# Patient Record
Sex: Female | Born: 1975 | Race: Black or African American | Hispanic: No | Marital: Married | Smoking: Never smoker
Health system: Southern US, Community
[De-identification: ages and names within clinical notes are randomized; demographics above are authoritative.]

## PROBLEM LIST (undated history)

## (undated) ENCOUNTER — Emergency Department (HOSPITAL_COMMUNITY): Admission: EM | Source: Ambulatory Visit

## (undated) ENCOUNTER — Inpatient Hospital Stay (HOSPITAL_COMMUNITY): Payer: Self-pay

## (undated) DIAGNOSIS — E785 Hyperlipidemia, unspecified: Secondary | ICD-10-CM

## (undated) DIAGNOSIS — Z98891 History of uterine scar from previous surgery: Secondary | ICD-10-CM

## (undated) DIAGNOSIS — I10 Essential (primary) hypertension: Secondary | ICD-10-CM

## (undated) DIAGNOSIS — E119 Type 2 diabetes mellitus without complications: Secondary | ICD-10-CM

## (undated) DIAGNOSIS — E669 Obesity, unspecified: Secondary | ICD-10-CM

## (undated) DIAGNOSIS — Z302 Encounter for sterilization: Secondary | ICD-10-CM

## (undated) HISTORY — DX: Type 2 diabetes mellitus without complications: E11.9

## (undated) HISTORY — DX: Hyperlipidemia, unspecified: E78.5

## (undated) HISTORY — DX: Essential (primary) hypertension: I10

## (undated) HISTORY — PX: GASTRIC BYPASS: SHX52

---

## 2007-02-15 ENCOUNTER — Ambulatory Visit: Payer: Self-pay | Admitting: Family Medicine

## 2007-02-16 ENCOUNTER — Ambulatory Visit: Payer: Self-pay | Admitting: Internal Medicine

## 2007-02-17 ENCOUNTER — Ambulatory Visit: Payer: Self-pay | Admitting: *Deleted

## 2008-03-21 ENCOUNTER — Ambulatory Visit: Payer: Self-pay | Admitting: Internal Medicine

## 2008-05-17 ENCOUNTER — Ambulatory Visit: Payer: Self-pay | Admitting: Gynecology

## 2008-05-31 ENCOUNTER — Encounter: Payer: Self-pay | Admitting: Obstetrics & Gynecology

## 2008-05-31 ENCOUNTER — Ambulatory Visit: Payer: Self-pay | Admitting: Obstetrics & Gynecology

## 2009-02-14 ENCOUNTER — Ambulatory Visit: Payer: Self-pay | Admitting: Obstetrics & Gynecology

## 2009-02-14 ENCOUNTER — Ambulatory Visit (HOSPITAL_COMMUNITY): Admission: RE | Admit: 2009-02-14 | Discharge: 2009-02-14 | Payer: Self-pay | Admitting: Obstetrics & Gynecology

## 2009-02-14 ENCOUNTER — Encounter: Payer: Self-pay | Admitting: Obstetrics and Gynecology

## 2009-02-14 LAB — CONVERTED CEMR LAB
GC Probe Amp, Genital: NEGATIVE
Hgb A2 Quant: 3.2 % (ref 2.2–3.2)
Hgb F Quant: 0.8 % (ref 0.0–2.0)

## 2009-03-14 ENCOUNTER — Ambulatory Visit: Payer: Self-pay | Admitting: Obstetrics & Gynecology

## 2009-03-14 ENCOUNTER — Ambulatory Visit (HOSPITAL_COMMUNITY): Admission: RE | Admit: 2009-03-14 | Discharge: 2009-03-14 | Payer: Self-pay | Admitting: Obstetrics & Gynecology

## 2009-03-14 ENCOUNTER — Encounter: Payer: Self-pay | Admitting: Obstetrics and Gynecology

## 2009-03-14 LAB — CONVERTED CEMR LAB
Basophils Absolute: 0 10*3/uL (ref 0.0–0.1)
Basophils Relative: 0 % (ref 0–1)
Hepatitis B Surface Ag: NEGATIVE
MCHC: 33.3 g/dL (ref 30.0–36.0)
Monocytes Relative: 9 % (ref 3–12)
Neutro Abs: 5.3 10*3/uL (ref 1.7–7.7)
Neutrophils Relative %: 65 % (ref 43–77)
RBC: 3.67 M/uL — ABNORMAL LOW (ref 3.87–5.11)
RDW: 15.1 % (ref 11.5–15.5)

## 2009-04-04 ENCOUNTER — Ambulatory Visit: Payer: Self-pay | Admitting: Obstetrics & Gynecology

## 2009-04-28 ENCOUNTER — Ambulatory Visit: Payer: Self-pay | Admitting: Advanced Practice Midwife

## 2009-04-28 ENCOUNTER — Inpatient Hospital Stay (HOSPITAL_COMMUNITY): Admission: AD | Admit: 2009-04-28 | Discharge: 2009-04-28 | Payer: Self-pay | Admitting: Family Medicine

## 2009-05-02 ENCOUNTER — Ambulatory Visit: Payer: Self-pay | Admitting: Obstetrics and Gynecology

## 2009-08-02 ENCOUNTER — Ambulatory Visit: Payer: Self-pay | Admitting: Obstetrics & Gynecology

## 2009-08-02 ENCOUNTER — Inpatient Hospital Stay (HOSPITAL_COMMUNITY): Admission: RE | Admit: 2009-08-02 | Discharge: 2009-08-05 | Payer: Self-pay | Admitting: Obstetrics & Gynecology

## 2010-11-26 LAB — CBC
HCT: 40.3 % (ref 36.0–46.0)
Hemoglobin: 12.2 g/dL (ref 12.0–15.0)
Hemoglobin: 13.7 g/dL (ref 12.0–15.0)
MCHC: 33.6 g/dL (ref 30.0–36.0)
MCHC: 34.1 g/dL (ref 30.0–36.0)
MCV: 95.7 fL (ref 78.0–100.0)
MCV: 98 fL (ref 78.0–100.0)
RBC: 3.72 MIL/uL — ABNORMAL LOW (ref 3.87–5.11)
RBC: 4.21 MIL/uL (ref 3.87–5.11)

## 2010-11-26 LAB — CROSSMATCH

## 2010-11-26 LAB — RPR: RPR Ser Ql: NONREACTIVE

## 2010-11-29 LAB — CBC
Hemoglobin: 12.2 g/dL (ref 12.0–15.0)
MCHC: 34.3 g/dL (ref 30.0–36.0)
RBC: 3.78 MIL/uL — ABNORMAL LOW (ref 3.87–5.11)
WBC: 7.4 10*3/uL (ref 4.0–10.5)

## 2010-11-29 LAB — DIFFERENTIAL
Basophils Relative: 0 % (ref 0–1)
Lymphocytes Relative: 22 % (ref 12–46)
Lymphs Abs: 1.7 10*3/uL (ref 0.7–4.0)
Monocytes Absolute: 1 10*3/uL (ref 0.1–1.0)
Monocytes Relative: 13 % — ABNORMAL HIGH (ref 3–12)
Neutro Abs: 4.8 10*3/uL (ref 1.7–7.7)

## 2010-11-29 LAB — POCT URINALYSIS DIP (DEVICE)
Bilirubin Urine: NEGATIVE
Hgb urine dipstick: NEGATIVE
Ketones, ur: 80 mg/dL — AB
Protein, ur: 30 mg/dL — AB
Specific Gravity, Urine: 1.02 (ref 1.005–1.030)
pH: 6 (ref 5.0–8.0)

## 2010-11-29 LAB — URINALYSIS, ROUTINE W REFLEX MICROSCOPIC
Bilirubin Urine: NEGATIVE
Nitrite: NEGATIVE
Specific Gravity, Urine: >1.03 — ABNORMAL HIGH (ref 1.005–1.030)
pH: 6 (ref 5.0–8.0)

## 2010-11-30 LAB — POCT URINALYSIS DIP (DEVICE)
Hgb urine dipstick: NEGATIVE
Ketones, ur: 15 mg/dL — AB
Protein, ur: 30 mg/dL — AB
Specific Gravity, Urine: >=1.03 (ref 1.005–1.030)
Urobilinogen, UA: 1 mg/dL (ref 0.0–1.0)
pH: 6 (ref 5.0–8.0)

## 2010-12-01 LAB — POCT URINALYSIS DIP (DEVICE)
Hgb urine dipstick: NEGATIVE
Nitrite: NEGATIVE
Specific Gravity, Urine: 1.025 (ref 1.005–1.030)
Urobilinogen, UA: 0.2 mg/dL (ref 0.0–1.0)
pH: 5.5 (ref 5.0–8.0)

## 2010-12-02 LAB — POCT URINALYSIS DIP (DEVICE)
Hgb urine dipstick: NEGATIVE
Nitrite: NEGATIVE
Protein, ur: 30 mg/dL — AB
pH: 6 (ref 5.0–8.0)

## 2011-01-07 NOTE — Group Therapy Note (Signed)
NAMEFRANCA, STAKES NO.:  [id number]   MEDICAL RECORD NO.:  [id number]          PATIENT TYPE:  WOC   LOCATION:  [facility]                   FACILITY:  [facility]   PHYSICIAN:  Allie Bossier, MD        DATE OF BIRTH:  [birth year]   DATE OF SERVICE:                                  CLINIC NOTE   IDENTIFICATION:  Penny Luna is a 35-yeawr-old married [place], gravida  2, para 80, with an 37-year-old daughter and a 50-year-old son.  She comes  here because she has never had a Pap smear and she has been having  unprotected intercourse for the last 2 years attempting to achieve  pregnancy.  She has not succeeded with a pregnancy and wants to.  She  has been on folic acid and multivitamins on a regular basis.  She also  reports that [place], she has gained  70 pounds.   PAST MEDICAL HISTORY:  Obesity.   PAST SURGICAL HISTORY:  A cesarean section x2.   No known drug allergies.  No latex allergies.   Social history is negative except occasional alcohol.   Family history is positive for question cervical cancer in her mother  who is deceased.   REVIEW OF SYSTEMS:  She has been married for the last 2 years.  This is  a different man than the father of her children.  She is a Printmaker at  [employer]  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   MEDICATIONS:  She takes multivitamins and folic acid daily as well as  Tylenol on an as-necessary basis.   PHYSICAL EXAMINATION:  VITAL SIGNS:  Weight 242 pounds, height is 5 feet  and 5 inches, blood pressure 132/80, pulse 94.  She is afebrile.  HEENT:  Normal.  HEART:  Regular rate and rhythm.  LUNGS:  Clear to auscultation bilaterally.  BREASTS:  Large with no discrete masses or nipple discharge or skin  changes.  ABDOMEN:  Obese, benign.  EXTERNAL GENITALIA:  No lesions.  Cervix nulliparous, no lesions.  BIMANUAL:  Her uterus is normal size, shape, anteverted.  Her adnexa are  nontender, and there are no  masses.   ASSESSMENT AND PLAN:  1. Annual exam.  Checked a Pap smear along with cervical cultures,      recommend self-breast and self-vulvar exams monthly.  2. A 70-pound weight gain - probably related to her new American diet;      however, we will happily check a thyroid-stimulating hormone today.  3. Obesity.  I have certainly recommended weight loss and I will check      a random blood sugar today.  4. Infertility.  I will check a day-3 follicle-stimulating hormone on      Sheleen, and her husband will need to produce a specimen.  She will      also keep a menstrual calendar and bring this to [place] in      approximately 2 months.      Allie Bossier, MD     MCD/MEDQ  D:  05/31/2008  T:  06/01/2008  Job:  [id number]

## 2013-02-05 ENCOUNTER — Emergency Department (HOSPITAL_COMMUNITY)
Admission: EM | Admit: 2013-02-05 | Discharge: 2013-02-06 | Disposition: A | Discharge status: Home / Self Care | Payer: Medicaid Other | Attending: Emergency Medicine | Admitting: Emergency Medicine

## 2013-02-05 ENCOUNTER — Encounter (HOSPITAL_COMMUNITY): Payer: Self-pay | Admitting: Emergency Medicine

## 2013-02-05 DIAGNOSIS — O219 Vomiting of pregnancy, unspecified: Secondary | ICD-10-CM

## 2013-02-05 DIAGNOSIS — R110 Nausea: Secondary | ICD-10-CM | POA: Insufficient documentation

## 2013-02-05 DIAGNOSIS — R509 Fever, unspecified: Secondary | ICD-10-CM | POA: Insufficient documentation

## 2013-02-05 DIAGNOSIS — R197 Diarrhea, unspecified: Secondary | ICD-10-CM | POA: Insufficient documentation

## 2013-02-05 LAB — COMPREHENSIVE METABOLIC PANEL
AST: 24 U/L (ref 0–37)
Albumin: 3.5 g/dL (ref 3.5–5.2)
Alkaline Phosphatase: 39 U/L (ref 39–117)
Chloride: 101 mEq/L (ref 96–112)
Potassium: 4.4 mEq/L (ref 3.5–5.1)
Sodium: 133 mEq/L — ABNORMAL LOW (ref 135–145)
Total Bilirubin: 0.2 mg/dL — ABNORMAL LOW (ref 0.3–1.2)

## 2013-02-05 LAB — URINALYSIS, ROUTINE W REFLEX MICROSCOPIC
Bilirubin Urine: NEGATIVE
Glucose, UA: NEGATIVE mg/dL
Hgb urine dipstick: NEGATIVE
Leukocytes, UA: NEGATIVE
Nitrite: NEGATIVE
Specific Gravity, Urine: 1.023 (ref 1.005–1.030)

## 2013-02-05 LAB — CBC WITH DIFFERENTIAL/PLATELET
Basophils Absolute: 0 10*3/uL (ref 0.0–0.1)
Basophils Relative: 0 % (ref 0–1)
Hemoglobin: 12.4 g/dL (ref 12.0–15.0)
MCHC: 35.6 g/dL (ref 30.0–36.0)
Neutro Abs: 5.9 10*3/uL (ref 1.7–7.7)
Neutrophils Relative %: 57 % (ref 43–77)
RDW: 14.1 % (ref 11.5–15.5)

## 2013-02-05 MED ORDER — ONDANSETRON HCL 4 MG/2ML IJ SOLN
4.0000 mg | INTRAMUSCULAR | Status: AC
  Administered 2013-02-05: 4 mg via INTRAVENOUS
  Filled 2013-02-05: qty 2

## 2013-02-05 MED ORDER — ACETAMINOPHEN 325 MG PO TABS
650.0000 mg | ORAL_TABLET | Freq: Once | ORAL | Status: AC
  Administered 2013-02-05: 650 mg via ORAL
  Filled 2013-02-05: qty 2

## 2013-02-05 MED ORDER — SODIUM CHLORIDE 0.9 % IV BOLUS (SEPSIS)
1000.0000 mL | Freq: Once | INTRAVENOUS | Status: AC
  Administered 2013-02-05: 1000 mL via INTRAVENOUS

## 2013-02-05 MED ORDER — ONDANSETRON HCL 4 MG PO TABS: 4.0000 mg | ORAL_TABLET | Freq: Three times a day (TID) | ORAL | Status: DC | PRN

## 2013-02-05 MED ORDER — SODIUM CHLORIDE 0.9 % IV BOLUS (SEPSIS): 1000.0000 mL | Freq: Once | INTRAVENOUS | Status: DC

## 2013-02-05 NOTE — ED Notes (Signed)
Kelly Humes, PA at bedside. 

## 2013-02-05 NOTE — ED Notes (Signed)
PT. REPORTS EMESIS ONSET 2 WEEKS AGO / DIARRHEA ONSET 2 DAYS AGO , PT. STATED SHE IS [redacted] WEEKS PREGNANT - NO PRENATAL VISITS , DENIES VAGINAL BLEEDING OR CRAMPING .

## 2013-02-05 NOTE — ED Provider Notes (Signed)
History     CSN: [id number]  Arrival date & time 02/05/13  1918   First MD Initiated Contact with Patient 02/05/13 2032      Chief Complaint  Patient presents with  . Emesis During Pregnancy    (Consider location/radiation/quality/duration/timing/severity/associated sxs/prior treatment) HPI Comments: Patient is a 37 year old female with no significant past medical history who presents for nausea and NB/NB emesis x 3 weeks, gradually worsening x 3 days. Patient states she is [redacted] weeks pregnant and has yet to be for a prenatal visit. Patient endorses improvement in symptoms with ginger ale when symptoms began, but this is no longer helping her. She denies aggravating factors. Patient admits to associated subjective fevers ("felt warm") and watery, nonbloody diarrhea x 3 days that is intermittent. She denies CP, SOB, abdominal pain or cramping, vaginal bleeding, vaginal d/c, dysuria, hematuria, and numbness or tingling in her extremities.  The history is provided by the patient. No language interpreter was used.    History reviewed. No pertinent past medical history.  Past Surgical History  Procedure Laterality Date  . Cesarean section      No family history on file.  History  Substance Use Topics  . Smoking status: Never Smoker   . Smokeless tobacco: Not on file  . Alcohol Use: No    OB History   Grav Para Term Preterm Abortions TAB SAB Ect Mult Living                  Review of Systems  Constitutional: Positive for fever (subjective). Negative for chills.  Respiratory: Negative for shortness of breath.   Cardiovascular: Negative for chest pain.  Gastrointestinal: Positive for nausea, vomiting and diarrhea. Negative for abdominal pain and blood in stool.  Genitourinary: Negative for dysuria, frequency, hematuria, vaginal bleeding, vaginal discharge, vaginal pain and pelvic pain.  Neurological: Negative for weakness and numbness.  All other systems reviewed and are  negative.    Allergies  Review of patient's allergies indicates no known allergies.  Home Medications   Current Outpatient Rx  Name  Route  Sig  Dispense  Refill  . Prenatal Vit-Fe Fumarate-FA (PRENATAL MULTIVITAMIN) TABS   Oral   Take 1 tablet by mouth daily at 12 noon.         . ondansetron (ZOFRAN) 4 MG tablet   Oral   Take 1 tablet (4 mg total) by mouth every 8 (eight) hours as needed for nausea.   30 tablet   0     BP 116/64  Pulse 81  Temp(Src) 98.3 F (36.8 C) (Oral)  Resp 16  SpO2 100%  LMP 12/29/2012  Physical Exam  Nursing note and vitals reviewed. Constitutional: She is oriented to person, place, and time. She appears well-developed and well-nourished. No distress.  HENT:  Head: Normocephalic and atraumatic.  Mouth/Throat: Oropharynx is clear and moist. No oropharyngeal exudate.  Eyes: Conjunctivae and EOM are normal. Pupils are equal, round, and reactive to light. No scleral icterus.  Neck: Normal range of motion. Neck supple.  Cardiovascular: Normal rate, regular rhythm, normal heart sounds and intact distal pulses.   Pulmonary/Chest: Effort normal and breath sounds normal. No respiratory distress. She has no wheezes. She has no rales.  Abdominal: Soft. Bowel sounds are normal. She exhibits no distension. There is no tenderness. There is no rebound and no guarding.  Obese abdomen. No peritoneal signs or palpable abdominal masses.  Musculoskeletal: Normal range of motion. She exhibits no edema.  Lymphadenopathy:    She  has no cervical adenopathy.  Neurological: She is alert and oriented to person, place, and time.  Skin: Skin is warm and dry. No rash noted. She is not diaphoretic. No erythema. No pallor.  Psychiatric: She has a normal mood and affect. Her behavior is normal.    ED Course  Procedures (including critical care time)  Labs Reviewed  URINALYSIS, ROUTINE W REFLEX MICROSCOPIC - Abnormal; Notable for the following:    APPearance CLOUDY  (*)    All other components within normal limits  CBC WITH DIFFERENTIAL - Abnormal; Notable for the following:    HCT 34.8 (*)    All other components within normal limits  COMPREHENSIVE METABOLIC PANEL - Abnormal; Notable for the following:    Sodium 133 (*)    Total Bilirubin 0.2 (*)    All other components within normal limits  POCT PREGNANCY, URINE - Abnormal; Notable for the following:    Preg Test, Ur POSITIVE (*)    All other components within normal limits   No results found.   1. Nausea and vomiting in pregnancy     MDM  37 year old female with positive urine pregnancy in ED who presents for nausea and emesis x3 weeks worsening over the last 3 days. On physical exam patient is extremely well and nontoxic appearing, in no acute distress. No focal abdominal tenderness or peritoneal signs elicited on physical exam; heart RRR, lungs CTAB. Labs without leukocytosis, anemia, evidence of hemoconcentration, or significant electrolyte imbalance. Liver and kidney function preserved. Urinalysis without evidence of infection. Symptoms c/w hyperemesis gravidarum. Will treat with IV fluids and Zofran. Patient complaining also of mild frontal throbbing headache; tylenol PO ordered. Do not believe further work up with imaging is warranted at this time given lack of complicating features of pregnancy such as abdominal pain or cramping and vaginal bleeding or d/c.   Patient endorses improvement in symptoms with IV fluids and Zofran. Patient tolerating fluids by mouth without emesis. Patient also states that headache is resolved with Tylenol. Patient appropriate for discharge with OB/GYN followup. Resource guide provided as well as a prescription for Zofran to take as needed for nausea and vomiting. Indications for ED return discussed. Patient verbalizes comfort and understanding with this discharge plan with no unaddressed concerns.      Antony Madura, PA-C 02/06/13 [phone]

## 2013-02-05 NOTE — ED Notes (Signed)
Second dose of zofran given, patient given ginger ale for fluid challenge will continue to monitor

## 2013-02-06 NOTE — ED Provider Notes (Signed)
Medical screening examination/treatment/procedure(s) were performed by non-physician practitioner and as supervising physician I was immediately available for consultation/collaboration.  Hurman Horn, MD 02/06/13 [phone]

## 2013-04-06 LAB — OB RESULTS CONSOLE HIV ANTIBODY (ROUTINE TESTING): HIV: NONREACTIVE

## 2013-04-06 LAB — OB RESULTS CONSOLE RPR: RPR: NONREACTIVE

## 2013-07-23 ENCOUNTER — Inpatient Hospital Stay (HOSPITAL_COMMUNITY)
Admission: AD | Admit: 2013-07-23 | Discharge: 2013-07-23 | Disposition: A | Discharge status: Home / Self Care | Payer: Medicaid Other | Source: Ambulatory Visit | Attending: Obstetrics and Gynecology | Admitting: Obstetrics and Gynecology

## 2013-07-23 ENCOUNTER — Encounter (HOSPITAL_COMMUNITY): Payer: Self-pay

## 2013-07-23 DIAGNOSIS — O4700 False labor before 37 completed weeks of gestation, unspecified trimester: Secondary | ICD-10-CM | POA: Insufficient documentation

## 2013-07-23 DIAGNOSIS — O99891 Other specified diseases and conditions complicating pregnancy: Secondary | ICD-10-CM | POA: Insufficient documentation

## 2013-07-23 DIAGNOSIS — N949 Unspecified condition associated with female genital organs and menstrual cycle: Secondary | ICD-10-CM

## 2013-07-23 DIAGNOSIS — R109 Unspecified abdominal pain: Secondary | ICD-10-CM | POA: Insufficient documentation

## 2013-07-23 LAB — URINALYSIS, ROUTINE W REFLEX MICROSCOPIC
Bilirubin Urine: NEGATIVE
Glucose, UA: NEGATIVE mg/dL
Ketones, ur: NEGATIVE mg/dL
Leukocytes, UA: NEGATIVE
pH: 6.5 (ref 5.0–8.0)

## 2013-07-23 LAB — CBC
HCT: 35.8 % — ABNORMAL LOW (ref 36.0–46.0)
Hemoglobin: 12.5 g/dL (ref 12.0–15.0)
MCHC: 34.9 g/dL (ref 30.0–36.0)

## 2013-07-23 MED ORDER — CYCLOBENZAPRINE HCL 10 MG PO TABS
10.0000 mg | ORAL_TABLET | Freq: Once | ORAL | Status: AC
  Administered 2013-07-23: 10 mg via ORAL
  Filled 2013-07-23: qty 1

## 2013-07-23 MED ORDER — CYCLOBENZAPRINE HCL 10 MG PO TABS: 10.0000 mg | ORAL_TABLET | Freq: Two times a day (BID) | ORAL | Status: DC | PRN

## 2013-07-23 NOTE — MAU Note (Signed)
Pt states for past two days has had pain on right side/flank. Denies burning. Has been constipated. Denies bleeding or vag d/c changes

## 2013-07-23 NOTE — MAU Provider Note (Signed)
History     CSN: [id number]  Arrival date and time: 07/23/13 1159   First Provider Initiated Contact with Patient 07/23/13 1257      Chief Complaint  Patient presents with  . Abdominal Pain   HPI [name] is a 37 y.o. [phone] at [redacted]w[redacted]d who presents to MAU today with complaint of right sided abdominal pain x 2-3 weeks. The patient states that it became more severe last night. She took Tylenol without relief. She states that the pain is worse with ambulation and change of position. 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 She has had some N/V throughout the pregnancy, last episode was 2 days ago. She is having some heartburn as well. She states that she is having occasional contractions. She denies vaginal bleeding, discharge, LOF or fever. She reports good fetal movement.   OB History   Grav Para Term Preterm Abortions TAB SAB Ect Mult Living   4 3 3       3       History reviewed. No pertinent past medical history.  Past Surgical History  Procedure Laterality Date  . Cesarean section      History reviewed. No pertinent family history.  History  Substance Use Topics  . Smoking status: Never Smoker   . Smokeless tobacco: Not on file  . Alcohol Use: No    Allergies:  Allergies  Allergen Reactions  . Lactose Intolerance (Gi) Nausea Only    Prescriptions prior to admission  Medication Sig Dispense Refill  . Prenatal Vit-Fe Fumarate-FA (PRENATAL MULTIVITAMIN) TABS Take 1 tablet by mouth daily at 12 noon.        Review of Systems  Constitutional: Negative for fever and malaise/fatigue.  Gastrointestinal: Positive for nausea, vomiting, abdominal pain and constipation. Negative for diarrhea.  Genitourinary: Negative for dysuria, urgency and frequency.       Neg - vaginal bleeding, discharge, LOF   Physical Exam   Blood pressure 138/80, pulse 100, temperature 97.7 F (36.5 C), temperature source Oral, resp. rate 16, last menstrual period  12/29/2012.  Physical Exam  Constitutional: She is oriented to person, place, and time. She appears well-developed and well-nourished. No distress.  HENT:  Head: Normocephalic and atraumatic.  Cardiovascular: Normal rate, regular rhythm and normal heart sounds.   Respiratory: Effort normal and breath sounds normal. No respiratory distress.  GI: Soft. Bowel sounds are normal. She exhibits no distension and no mass. There is tenderness (moderate tenderness to palpation of the right mid abdomen). There is no rebound and no guarding.  Neurological: She is alert and oriented to person, place, and time.  Skin: Skin is warm and dry. No erythema.  Psychiatric: She has a normal mood and affect.  Dilation: Closed Effacement (%): Thick Exam by:: Sharen Hint RN/Ginger Cornelius Moras  Results for orders placed during the hospital encounter of 07/23/13 (from the past 24 hour(s))  URINALYSIS, ROUTINE W REFLEX MICROSCOPIC     Status: None   Collection Time    07/23/13 12:05 PM      Result Value Range   Color, Urine YELLOW  YELLOW   APPearance CLEAR  CLEAR   Specific Gravity, Urine 1.010  1.005 - 1.030   pH 6.5  5.0 - 8.0   Glucose, UA NEGATIVE  NEGATIVE mg/dL   Hgb urine dipstick NEGATIVE  NEGATIVE   Bilirubin Urine NEGATIVE  NEGATIVE   Ketones, ur NEGATIVE  NEGATIVE mg/dL   Protein, ur NEGATIVE  NEGATIVE mg/dL   Urobilinogen, UA 0.2  0.0 - 1.0 mg/dL   Nitrite NEGATIVE  NEGATIVE   Leukocytes, UA NEGATIVE  NEGATIVE  CBC     Status: Abnormal   Collection Time    07/23/13  1:27 PM      Result Value Range   WBC 9.3  4.0 - 10.5 K/uL   RBC 3.93  3.87 - 5.11 MIL/uL   Hemoglobin 12.5  12.0 - 15.0 g/dL   HCT 16.1 (*) 09.6 - 04.5 %   MCV 91.1  78.0 - 100.0 fL   MCH 31.8  26.0 - 34.0 pg   MCHC 34.9  30.0 - 36.0 g/dL   RDW 40.9  81.1 - 91.4 %   Platelets 308  150 - 400 K/uL   Fetal Monitoring: Baseline: 130 bpm, moderate variability, + accelerations, no decelerations Contractions: None MAU  Course  Procedures None  MDM Discussed with Dr. Senaida Ores. Ok for discharge with Flexeril and moderation of work activities.   Assessment and Plan  A: Round ligament pain  P: Discharge home Rx for Flexeril sent to patient's pharmacy Discussed labor precautions Work note given with lifting restrictions Patient advised to follow-up in the office as scheduled Patient may return to MAU as needed or if her condition were to change or worsen  Freddi Starr, PA-C  07/23/2013, 2:17 PM

## 2013-07-31 ENCOUNTER — Encounter (HOSPITAL_COMMUNITY): Payer: Self-pay | Admitting: *Deleted

## 2013-07-31 ENCOUNTER — Inpatient Hospital Stay (HOSPITAL_COMMUNITY)
Admission: AD | Admit: 2013-07-31 | Discharge: 2013-08-01 | Disposition: A | Discharge status: Home / Self Care | Payer: Worker's Compensation | Source: Ambulatory Visit | Attending: Obstetrics and Gynecology | Admitting: Obstetrics and Gynecology

## 2013-07-31 DIAGNOSIS — O99891 Other specified diseases and conditions complicating pregnancy: Secondary | ICD-10-CM | POA: Insufficient documentation

## 2013-07-31 DIAGNOSIS — W010XXA Fall on same level from slipping, tripping and stumbling without subsequent striking against object, initial encounter: Secondary | ICD-10-CM | POA: Insufficient documentation

## 2013-07-31 DIAGNOSIS — W108XXA Fall (on) (from) other stairs and steps, initial encounter: Secondary | ICD-10-CM

## 2013-07-31 DIAGNOSIS — Y9229 Other specified public building as the place of occurrence of the external cause: Secondary | ICD-10-CM | POA: Insufficient documentation

## 2013-07-31 DIAGNOSIS — W19XXXA Unspecified fall, initial encounter: Secondary | ICD-10-CM

## 2013-07-31 DIAGNOSIS — M25469 Effusion, unspecified knee: Secondary | ICD-10-CM | POA: Insufficient documentation

## 2013-07-31 HISTORY — DX: Obesity, unspecified: E66.9

## 2013-07-31 LAB — URINALYSIS, ROUTINE W REFLEX MICROSCOPIC
Glucose, UA: NEGATIVE mg/dL
Hgb urine dipstick: NEGATIVE
Protein, ur: NEGATIVE mg/dL

## 2013-07-31 NOTE — MAU Note (Signed)
Pt tripped over the footrest of a wheelchair at work and fell onto her right knee, them right hip and right side of abdomen.  Her knee is very sore and slightly swollen as well as her low abdomen.  States her right hip hurts upon walking.  Denies any vag bleeding or leaking, reports good fetal movement.

## 2013-07-31 NOTE — MAU Provider Note (Signed)
  History     CSN: [id number]  Arrival date and time: 07/31/13 2028   First Provider Initiated Contact with Patient 07/31/13 2113      No chief complaint on file.  HPI  Penny Luna is a 37 y.o. G4P3003 at [redacted]w[redacted]d who presents today after a fall at work. She states that at 1905 she tripped over a wheelchair footrest. She then fell onto her right knee and then her right sided. She did hit her abdomen in the process. She denies any VB, LOF or UCs, and states that the baby has been moving normally. She does report feeling sore, and some knee pain as well.   Past Medical History  Diagnosis Date  . Obese     Past Surgical History  Procedure Laterality Date  . Cesarean section      Family History  Problem Relation Age of Onset  . Cancer Mother   . Hypertension Mother   . Diabetes Maternal Aunt   . Diabetes Cousin     History  Substance Use Topics  . Smoking status: Never Smoker   . Smokeless tobacco: Not on file  . Alcohol Use: No    Allergies:  Allergies  Allergen Reactions  . Lactose Intolerance (Gi) Nausea Only    Prescriptions prior to admission  Medication Sig Dispense Refill  . Prenatal Vit-Fe Fumarate-FA (PRENATAL MULTIVITAMIN) TABS Take 1 tablet by mouth daily at 12 noon.        ROS Physical Exam   Blood pressure 135/70, pulse 112, temperature 98.2 F (36.8 C), temperature source Oral, resp. rate 18, height 5\' 7"  (1.702 m), weight 122.471 kg (270 lb), last menstrual period 12/29/2012.  Physical Exam  Nursing note and vitals reviewed. Constitutional: She is oriented to person, place, and time. She appears well-developed and well-nourished. No distress.  Cardiovascular: Normal rate.   Respiratory: Effort normal.  GI: Soft. There is no tenderness.  Genitourinary:   Closed/thick/high   Musculoskeletal: Normal range of motion.  Right knee is swollen, but maintains ROM.   Neurological: She is alert and oriented to person, place, and time.  Skin: Skin is  warm and dry.  Psychiatric: She has a normal mood and affect.   NST: 140, moderate with accels, no decel Toco: rare UC MAU Course  Procedures  2139: D/W Dr. Ambrose Mantle: will monitor X 4 hours. If NST is reactive may be DC home. Continue to watch knee. If swelling or pain increases consider transfer/consult with [facility]. If pain/swelling is stable consider Urgent Care FU for tomorrow.   Assessment and Plan   1. Fall, initial encounter    Follow-up Information   Follow up with Bing Plume, MD. (as scheduled )    Specialty:  Obstetrics and Gynecology   Contact information:   [address], [address], [address] [place] [address] [phone]        Tawnya Crook 07/31/2013, 9:18 PM

## 2013-08-01 MED ORDER — OXYCODONE-ACETAMINOPHEN 5-325 MG PO TABS
2.0000 | ORAL_TABLET | Freq: Once | ORAL | Status: AC
  Administered 2013-08-01: 2 via ORAL
  Filled 2013-08-01: qty 2

## 2013-08-01 MED ORDER — HYDROCODONE-ACETAMINOPHEN 7.5-500 MG PO TABS: 1.0000 | ORAL_TABLET | Freq: Four times a day (QID) | ORAL | Status: DC | PRN

## 2013-08-12 ENCOUNTER — Encounter (HOSPITAL_COMMUNITY): Payer: Self-pay | Admitting: Pharmacist

## 2013-08-26 ENCOUNTER — Encounter (HOSPITAL_COMMUNITY)
Admission: RE | Admit: 2013-08-26 | Discharge: 2013-08-26 | Disposition: A | Discharge status: Home / Self Care | Payer: Medicaid Other | Source: Ambulatory Visit | Attending: Obstetrics and Gynecology | Admitting: Obstetrics and Gynecology

## 2013-08-26 ENCOUNTER — Encounter (HOSPITAL_COMMUNITY): Payer: Self-pay

## 2013-08-26 DIAGNOSIS — Z01818 Encounter for other preprocedural examination: Secondary | ICD-10-CM

## 2013-08-26 DIAGNOSIS — Z01812 Encounter for preprocedural laboratory examination: Secondary | ICD-10-CM | POA: Insufficient documentation

## 2013-08-26 LAB — CBC
HCT: 37.1 % (ref 36.0–46.0)
Hemoglobin: 13 g/dL (ref 12.0–15.0)
MCH: 32.1 pg (ref 26.0–34.0)
MCHC: 35 g/dL (ref 30.0–36.0)
MCV: 91.6 fL (ref 78.0–100.0)
PLATELETS: 300 10*3/uL (ref 150–400)
RBC: 4.05 MIL/uL (ref 3.87–5.11)
RDW: 14.7 % (ref 11.5–15.5)
WBC: 8 10*3/uL (ref 4.0–10.5)

## 2013-08-26 LAB — RPR: RPR Ser Ql: NONREACTIVE

## 2013-08-26 NOTE — Patient Instructions (Signed)
18 Penny Luna  08/26/2013   Your procedure is scheduled on:  08/29/13  Enter through the Main Entrance [facility] at 6 AM.  Pick up the phone at the desk and dial [phone].   Call this number if you have problems the morning of surgery: [phone]   Remember:   Do not eat food:After Midnight.  Do not drink clear liquids: After Midnight.  Take these medicines the morning of surgery with A SIP OF WATER: NA   Do not wear jewelry, make-up or nail polish.  Do not wear lotions, powders, or perfumes. You may wear deodorant.  Do not shave 48 hours prior to surgery.  Do not bring valuables to the hospital.  [facility] is not   responsible for any belongings or valuables brought to the hospital.  Contacts, dentures or bridgework may not be worn into surgery.  Leave suitcase in the car. After surgery it may be brought to your room.  For patients admitted to the hospital, checkout time is 11:00 AM the day of              discharge.   Patients discharged the day of surgery will not be allowed to drive             home.  Name and phone number of your driver: NA  Special Instructions:   Shower using CHG 2 nights before surgery and the night before surgery.  If you shower the day of surgery use CHG.  Use special wash - you have one bottle of CHG for all showers.  You should use approximately 1/3 of the bottle for each shower.   Please read over the following fact sheets that you were given:   Surgical Site Infection Prevention

## 2013-08-28 ENCOUNTER — Encounter (HOSPITAL_COMMUNITY): Payer: Self-pay | Admitting: Obstetrics and Gynecology

## 2013-08-28 DIAGNOSIS — Z302 Encounter for sterilization: Secondary | ICD-10-CM

## 2013-08-28 DIAGNOSIS — Z3480 Encounter for supervision of other normal pregnancy, unspecified trimester: Secondary | ICD-10-CM

## 2013-08-28 DIAGNOSIS — Z98891 History of uterine scar from previous surgery: Secondary | ICD-10-CM

## 2013-08-28 HISTORY — DX: Encounter for sterilization: Z30.2

## 2013-08-28 HISTORY — DX: History of uterine scar from previous surgery: Z98.891

## 2013-08-28 NOTE — H&P (Signed)
Garnette Czechliza Addo-Lumsden is a 38 y.o. female [phone] at 39+ for repeat LTCS also desires BTLby B salpingectomy. +FM, no LOF,no VB, occ ctx. D/W pt r/b/a of rLTCS and B salpingectomy including but not limited to bleeding, infection, damage to surrounding organs, trouble healing,and injury to infant.  Pregnancy relatively uncomplicated, except late entry to care, apx 16 wk and + GBBS  Maternal Medical History:  Contractions: Frequency: irregular.    Fetal activity: Perceived fetal activity is normal.    Prenatal Complications - Diabetes: none.    OB History   Grav Para Term Preterm Abortions TAB SAB Ect Mult Living   4 3 3       3     G1 5/01 breech, female, 7#12 LTCS G2 10/03 failed VBAC, female 7#10 LTCS G3 12/10 repeat LTCS 7#12, female G4 present No abn pap, no STDs  Past Medical History  Diagnosis Date  . Obese   . Encounter for sterilization 08/28/2013  . H/O: cesarean section 08/28/2013   Past Surgical History  Procedure Laterality Date  . Cesarean section    c/s x 3  Family History: family history includes Cancer in her mother; Diabetes in her cousin and maternal aunt; Hypertension in her mother. Social History:  reports that she has never smoked. She does not have any smokeless tobacco history on file. 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   HENT: Negative.   Eyes: Negative.   Respiratory: Negative.   Cardiovascular: Negative.   Gastrointestinal: Negative.   Genitourinary: Negative.   Musculoskeletal:  Negative.   Skin: Negative.   Neurological: Negative.   Psychiatric/Behavioral: Negative.       Last menstrual period 12/29/2012. Maternal Exam:  Uterine Assessment: Contraction frequency is irregular.   Abdomen: Surgical scars: low transverse.   Fundal height is appropriate for gestation (S>D, followed by US).   Estimated fetal weight is 8#4.   Fetal presentation: vertex  Introitus: Normal vulva. Normal vagina.    Physical Exam  Constitutional: She is oriented to person, place, and time. She appears well-developed and well-nourished.  HENT:  Head: Normocephalic and atraumatic.  Cardiovascular: Normal rate and regular rhythm.   Respiratory: Effort normal and breath sounds normal. No respiratory distress. She has no wheezes.  GI: Soft. Bowel sounds are normal. She exhibits no distension. There is no tenderness.  Musculoskeletal: Normal range of motion.  Neurological: She is alert and oriented to person, place, and time.  Skin: Skin is warm and dry.  Psychiatric: She has a normal mood and affect. Her behavior is normal.    Prenatal labs: ABO, Rh: --/--/O POS (01/02 0945) Antibody: NEG (01/02 0945) Rubella:  immune RPR: NON REACTIVE (01/02 0945)  HBsAg:   neg HIV: Non-reactive (08/13 0948)  GBS:   positive  Hgb 11.8/ Plt 402K, repeat 333K/Pap WNL/Ur Cx neg/Hgb electro WNL/GC neg/ Chl neg/ glucola 133  US 16+EDC 09/05/13, female US 19 wk, nl anat, rt wall placenta, female S>D 10/29 76% growth, nl AFI, vtx S>D 12/31 84% 8#4, nl AFI, vtx, rt wall plac  Tdap 06/06/13 Flu  05/16/13   Assessment/Plan: 37yo [id number] at 37 for rLTCS and B salpingectomy gbbs + Reviewed r/b/a of procedure   BOVARD,Velvet Moomaw 08/28/2013, 8:50 PM

## 2013-08-29 ENCOUNTER — Inpatient Hospital Stay (HOSPITAL_COMMUNITY): Payer: Medicaid Other | Admitting: Anesthesiology

## 2013-08-29 ENCOUNTER — Encounter (HOSPITAL_COMMUNITY)
Admission: RE | Disposition: A | Discharge status: Home / Self Care | Payer: Self-pay | Source: Ambulatory Visit | Attending: Obstetrics and Gynecology

## 2013-08-29 ENCOUNTER — Inpatient Hospital Stay (HOSPITAL_COMMUNITY)
Admission: RE | Admit: 2013-08-29 | Discharge: 2013-09-01 | DRG: 766 | Disposition: A | Discharge status: Home / Self Care | Payer: Medicaid Other | Source: Ambulatory Visit | Attending: Obstetrics and Gynecology | Admitting: Obstetrics and Gynecology

## 2013-08-29 ENCOUNTER — Encounter (HOSPITAL_COMMUNITY): Payer: Medicaid Other | Admitting: Anesthesiology

## 2013-08-29 ENCOUNTER — Encounter (HOSPITAL_COMMUNITY): Payer: Self-pay

## 2013-08-29 DIAGNOSIS — Z302 Encounter for sterilization: Secondary | ICD-10-CM

## 2013-08-29 DIAGNOSIS — Z22330 Carrier of Group B streptococcus: Secondary | ICD-10-CM

## 2013-08-29 DIAGNOSIS — O09529 Supervision of elderly multigravida, unspecified trimester: Secondary | ICD-10-CM | POA: Diagnosis present

## 2013-08-29 DIAGNOSIS — O34219 Maternal care for unspecified type scar from previous cesarean delivery: Principal | ICD-10-CM | POA: Diagnosis present

## 2013-08-29 DIAGNOSIS — O9989 Other specified diseases and conditions complicating pregnancy, childbirth and the puerperium: Secondary | ICD-10-CM

## 2013-08-29 DIAGNOSIS — Z3480 Encounter for supervision of other normal pregnancy, unspecified trimester: Secondary | ICD-10-CM

## 2013-08-29 DIAGNOSIS — O99892 Other specified diseases and conditions complicating childbirth: Secondary | ICD-10-CM | POA: Diagnosis present

## 2013-08-29 DIAGNOSIS — Z98891 History of uterine scar from previous surgery: Secondary | ICD-10-CM

## 2013-08-29 HISTORY — DX: Encounter for sterilization: Z30.2

## 2013-08-29 HISTORY — DX: History of uterine scar from previous surgery: Z98.891

## 2013-08-29 LAB — COMPREHENSIVE METABOLIC PANEL
ALK PHOS: 112 U/L (ref 39–117)
ALT: 17 U/L (ref 0–35)
AST: 23 U/L (ref 0–37)
Albumin: 2.6 g/dL — ABNORMAL LOW (ref 3.5–5.2)
BILIRUBIN TOTAL: 0.2 mg/dL — AB (ref 0.3–1.2)
BUN: 10 mg/dL (ref 6–23)
CHLORIDE: 101 meq/L (ref 96–112)
CO2: 23 meq/L (ref 19–32)
Calcium: 9.2 mg/dL (ref 8.4–10.5)
Creatinine, Ser: 0.73 mg/dL (ref 0.50–1.10)
GFR calc Af Amer: >90 mL/min (ref >90)
Glucose, Bld: 98 mg/dL (ref 70–99)
POTASSIUM: 4.5 meq/L (ref 3.7–5.3)
Sodium: 137 mEq/L (ref 137–147)
Total Protein: 5.7 g/dL — ABNORMAL LOW (ref 6.0–8.3)

## 2013-08-29 LAB — PREPARE RBC (CROSSMATCH)

## 2013-08-29 SURGERY — Surgical Case
Anesthesia: Spinal | Site: Abdomen | Laterality: Bilateral

## 2013-08-29 MED ORDER — METOCLOPRAMIDE HCL 5 MG/ML IJ SOLN: 10.0000 mg | Freq: Three times a day (TID) | INTRAMUSCULAR | Status: DC | PRN

## 2013-08-29 MED ORDER — DIPHENHYDRAMINE HCL 25 MG PO CAPS: 25.0000 mg | ORAL_CAPSULE | Freq: Four times a day (QID) | ORAL | Status: DC | PRN

## 2013-08-29 MED ORDER — ONDANSETRON HCL 4 MG/2ML IJ SOLN
INTRAMUSCULAR | Status: DC | PRN
  Administered 2013-08-29: 4 mg via INTRAVENOUS

## 2013-08-29 MED ORDER — SODIUM CHLORIDE 0.9 % IJ SOLN: 3.0000 mL | INTRAMUSCULAR | Status: DC | PRN

## 2013-08-29 MED ORDER — MENTHOL 3 MG MT LOZG: 1.0000 | LOZENGE | OROMUCOSAL | Status: DC | PRN

## 2013-08-29 MED ORDER — KETOROLAC TROMETHAMINE 30 MG/ML IJ SOLN
30.0000 mg | Freq: Four times a day (QID) | INTRAMUSCULAR | Status: AC | PRN
  Administered 2013-08-29: 30 mg via INTRAMUSCULAR

## 2013-08-29 MED ORDER — DIBUCAINE 1 % RE OINT: 1.0000 "application " | TOPICAL_OINTMENT | RECTAL | Status: DC | PRN

## 2013-08-29 MED ORDER — ONDANSETRON HCL 4 MG/2ML IJ SOLN: 4.0000 mg | INTRAMUSCULAR | Status: DC | PRN

## 2013-08-29 MED ORDER — SCOPOLAMINE 1 MG/3DAYS TD PT72
MEDICATED_PATCH | TRANSDERMAL | Status: AC
  Filled 2013-08-29: qty 1

## 2013-08-29 MED ORDER — BUPIVACAINE IN DEXTROSE 0.75-8.25 % IT SOLN
INTRATHECAL | Status: DC | PRN
  Administered 2013-08-29: 1.6 mL via INTRATHECAL

## 2013-08-29 MED ORDER — SIMETHICONE 80 MG PO CHEW
80.0000 mg | CHEWABLE_TABLET | ORAL | Status: DC
  Administered 2013-08-29 – 2013-08-31 (×3): 80 mg via ORAL
  Filled 2013-08-29 (×3): qty 1

## 2013-08-29 MED ORDER — ACETAMINOPHEN 500 MG PO TABS
1000.0000 mg | ORAL_TABLET | Freq: Four times a day (QID) | ORAL | Status: AC
  Administered 2013-08-29 – 2013-08-30 (×3): 1000 mg via ORAL
  Filled 2013-08-29 (×4): qty 2

## 2013-08-29 MED ORDER — DEXTROSE 5 % IV SOLN
3.0000 g | INTRAVENOUS | Status: AC
  Administered 2013-08-29: 3 g via INTRAVENOUS
  Filled 2013-08-29: qty 3000

## 2013-08-29 MED ORDER — ONDANSETRON HCL 4 MG PO TABS: 4.0000 mg | ORAL_TABLET | ORAL | Status: DC | PRN

## 2013-08-29 MED ORDER — IBUPROFEN 800 MG PO TABS
800.0000 mg | ORAL_TABLET | Freq: Three times a day (TID) | ORAL | Status: DC
  Administered 2013-08-29 – 2013-09-01 (×9): 800 mg via ORAL
  Filled 2013-08-29 (×9): qty 1

## 2013-08-29 MED ORDER — LACTATED RINGERS IV SOLN
Freq: Once | INTRAVENOUS | Status: AC
Start: 2013-08-29 — End: 2013-08-29
  Administered 2013-08-29 (×3): via INTRAVENOUS

## 2013-08-29 MED ORDER — WITCH HAZEL-GLYCERIN EX PADS: 1.0000 "application " | MEDICATED_PAD | CUTANEOUS | Status: DC | PRN

## 2013-08-29 MED ORDER — NALOXONE HCL 0.4 MG/ML IJ SOLN: 0.4000 mg | INTRAMUSCULAR | Status: DC | PRN

## 2013-08-29 MED ORDER — PRENATAL MULTIVITAMIN CH: 1.0000 | ORAL_TABLET | Freq: Every day | ORAL | Status: DC

## 2013-08-29 MED ORDER — LACTATED RINGERS IV SOLN
INTRAVENOUS | Status: DC
  Administered 2013-08-29 (×2): via INTRAVENOUS

## 2013-08-29 MED ORDER — FENTANYL CITRATE 0.05 MG/ML IJ SOLN: 25.0000 ug | INTRAMUSCULAR | Status: DC | PRN

## 2013-08-29 MED ORDER — IBUPROFEN 600 MG PO TABS: 600.0000 mg | ORAL_TABLET | Freq: Four times a day (QID) | ORAL | Status: DC | PRN

## 2013-08-29 MED ORDER — SENNOSIDES-DOCUSATE SODIUM 8.6-50 MG PO TABS
2.0000 | ORAL_TABLET | ORAL | Status: DC
  Administered 2013-08-29 – 2013-08-30 (×2): 2 via ORAL
  Filled 2013-08-29 (×3): qty 2

## 2013-08-29 MED ORDER — NALBUPHINE HCL 10 MG/ML IJ SOLN
5.0000 mg | INTRAMUSCULAR | Status: DC | PRN
  Administered 2013-08-29 (×2): 5 mg via SUBCUTANEOUS
  Filled 2013-08-29 (×3): qty 1

## 2013-08-29 MED ORDER — ONDANSETRON HCL 4 MG/2ML IJ SOLN
INTRAMUSCULAR | Status: AC
  Filled 2013-08-29: qty 2

## 2013-08-29 MED ORDER — PRENATAL MULTIVITAMIN CH
1.0000 | ORAL_TABLET | Freq: Every day | ORAL | Status: DC
  Administered 2013-08-29 – 2013-08-31 (×3): 1 via ORAL
  Filled 2013-08-29 (×3): qty 1

## 2013-08-29 MED ORDER — PHENYLEPHRINE 8 MG IN D5W 100 ML (0.08MG/ML) PREMIX OPTIME
INJECTION | INTRAVENOUS | Status: DC | PRN
Start: 2013-08-29 — End: 2013-08-29
  Administered 2013-08-29: 40 ug/min via INTRAVENOUS

## 2013-08-29 MED ORDER — DIPHENHYDRAMINE HCL 50 MG/ML IJ SOLN: 25.0000 mg | INTRAMUSCULAR | Status: DC | PRN

## 2013-08-29 MED ORDER — NALBUPHINE HCL 10 MG/ML IJ SOLN
5.0000 mg | INTRAMUSCULAR | Status: DC | PRN
  Administered 2013-08-30: 10 mg via INTRAVENOUS
  Filled 2013-08-29 (×2): qty 1

## 2013-08-29 MED ORDER — MORPHINE SULFATE 0.5 MG/ML IJ SOLN
INTRAMUSCULAR | Status: AC
  Filled 2013-08-29: qty 10

## 2013-08-29 MED ORDER — KETOROLAC TROMETHAMINE 30 MG/ML IJ SOLN: 30.0000 mg | Freq: Four times a day (QID) | INTRAMUSCULAR | Status: AC | PRN

## 2013-08-29 MED ORDER — SCOPOLAMINE 1 MG/3DAYS TD PT72
1.0000 | MEDICATED_PATCH | Freq: Once | TRANSDERMAL | Status: DC
  Filled 2013-08-29: qty 1

## 2013-08-29 MED ORDER — DEXTROSE 5 % IV SOLN
1.0000 ug/kg/h | INTRAVENOUS | Status: DC | PRN
  Filled 2013-08-29: qty 2

## 2013-08-29 MED ORDER — SCOPOLAMINE 1 MG/3DAYS TD PT72
1.0000 | MEDICATED_PATCH | Freq: Once | TRANSDERMAL | Status: DC
  Administered 2013-08-29: 1.5 mg via TRANSDERMAL

## 2013-08-29 MED ORDER — ZOLPIDEM TARTRATE 5 MG PO TABS: 5.0000 mg | ORAL_TABLET | Freq: Every evening | ORAL | Status: DC | PRN

## 2013-08-29 MED ORDER — PROMETHAZINE HCL 25 MG/ML IJ SOLN: 6.2500 mg | INTRAMUSCULAR | Status: DC | PRN

## 2013-08-29 MED ORDER — MIDAZOLAM HCL 2 MG/2ML IJ SOLN: 0.5000 mg | Freq: Once | INTRAMUSCULAR | Status: DC | PRN

## 2013-08-29 MED ORDER — ONDANSETRON HCL 4 MG/2ML IJ SOLN: 4.0000 mg | Freq: Three times a day (TID) | INTRAMUSCULAR | Status: DC | PRN

## 2013-08-29 MED ORDER — OXYTOCIN 10 UNIT/ML IJ SOLN
INTRAMUSCULAR | Status: AC
  Filled 2013-08-29: qty 4

## 2013-08-29 MED ORDER — SIMETHICONE 80 MG PO CHEW: 80.0000 mg | CHEWABLE_TABLET | ORAL | Status: DC | PRN

## 2013-08-29 MED ORDER — OXYTOCIN 40 UNITS IN LACTATED RINGERS INFUSION - SIMPLE MED: 62.5000 mL/h | INTRAVENOUS | Status: AC

## 2013-08-29 MED ORDER — PHENYLEPHRINE 8 MG IN D5W 100 ML (0.08MG/ML) PREMIX OPTIME
INJECTION | INTRAVENOUS | Status: AC
  Filled 2013-08-29: qty 100

## 2013-08-29 MED ORDER — FENTANYL CITRATE 0.05 MG/ML IJ SOLN
INTRAMUSCULAR | Status: DC | PRN
  Administered 2013-08-29: 25 ug via INTRATHECAL

## 2013-08-29 MED ORDER — LACTATED RINGERS IV SOLN
INTRAVENOUS | Status: DC | PRN
  Administered 2013-08-29: 08:00:00 via INTRAVENOUS

## 2013-08-29 MED ORDER — MORPHINE SULFATE (PF) 0.5 MG/ML IJ SOLN
INTRAMUSCULAR | Status: DC | PRN
  Administered 2013-08-29: .15 mg via INTRATHECAL

## 2013-08-29 MED ORDER — KETOROLAC TROMETHAMINE 30 MG/ML IJ SOLN
INTRAMUSCULAR | Status: AC
  Filled 2013-08-29: qty 1

## 2013-08-29 MED ORDER — LANOLIN HYDROUS EX OINT: 1.0000 "application " | TOPICAL_OINTMENT | CUTANEOUS | Status: DC | PRN

## 2013-08-29 MED ORDER — SIMETHICONE 80 MG PO CHEW
80.0000 mg | CHEWABLE_TABLET | Freq: Three times a day (TID) | ORAL | Status: DC
  Administered 2013-08-29 – 2013-09-01 (×9): 80 mg via ORAL
  Filled 2013-08-29 (×9): qty 1

## 2013-08-29 MED ORDER — LACTATED RINGERS IV SOLN
INTRAVENOUS | Status: DC
  Administered 2013-08-29: 07:00:00 via INTRAVENOUS

## 2013-08-29 MED ORDER — OXYCODONE-ACETAMINOPHEN 5-325 MG PO TABS
1.0000 | ORAL_TABLET | ORAL | Status: DC | PRN
  Administered 2013-08-29 – 2013-09-01 (×7): 1 via ORAL
  Filled 2013-08-29 (×7): qty 1

## 2013-08-29 MED ORDER — DIPHENHYDRAMINE HCL 50 MG/ML IJ SOLN: 12.5000 mg | INTRAMUSCULAR | Status: DC | PRN

## 2013-08-29 MED ORDER — DIPHENHYDRAMINE HCL 25 MG PO CAPS
25.0000 mg | ORAL_CAPSULE | ORAL | Status: DC | PRN
  Administered 2013-08-30: 25 mg via ORAL
  Filled 2013-08-29: qty 1

## 2013-08-29 MED ORDER — OXYTOCIN 10 UNIT/ML IJ SOLN
40.0000 [IU] | INTRAVENOUS | Status: DC | PRN
  Administered 2013-08-29: 40 [IU] via INTRAVENOUS

## 2013-08-29 MED ORDER — MEPERIDINE HCL 25 MG/ML IJ SOLN: 6.2500 mg | INTRAMUSCULAR | Status: DC | PRN

## 2013-08-29 MED ORDER — FENTANYL CITRATE 0.05 MG/ML IJ SOLN
INTRAMUSCULAR | Status: AC
  Filled 2013-08-29: qty 2

## 2013-08-29 SURGICAL SUPPLY — 40 items
APL SKNCLS STERI-STRIP NONHPOA (GAUZE/BANDAGES/DRESSINGS) ×1
BENZOIN TINCTURE PRP APPL 2/3 (GAUZE/BANDAGES/DRESSINGS) ×2 IMPLANT
CLAMP CORD UMBIL (MISCELLANEOUS) IMPLANT
CLOSURE WOUND 1/2 X4 (GAUZE/BANDAGES/DRESSINGS) ×1
CLOTH BEACON ORANGE TIMEOUT ST (SAFETY) ×3 IMPLANT
CONTAINER PREFILL 10% NBF 15ML (MISCELLANEOUS) ×4 IMPLANT
DRAPE LG THREE QUARTER DISP (DRAPES) IMPLANT
DRSG OPSITE POSTOP 4X10 (GAUZE/BANDAGES/DRESSINGS) ×3 IMPLANT
DURAPREP 26ML APPLICATOR (WOUND CARE) ×3 IMPLANT
ELECT REM PT RETURN 9FT ADLT (ELECTROSURGICAL) ×3
ELECTRODE REM PT RTRN 9FT ADLT (ELECTROSURGICAL) ×1 IMPLANT
EXTRACTOR VACUUM M CUP 4 TUBE (SUCTIONS) ×1 IMPLANT
EXTRACTOR VACUUM M CUP 4' TUBE (SUCTIONS) ×1
GLOVE BIO SURGEON STRL SZ 6.5 (GLOVE) ×2 IMPLANT
GLOVE BIO SURGEONS STRL SZ 6.5 (GLOVE) ×1
GOWN PREVENTION PLUS XLARGE (GOWN DISPOSABLE) ×3 IMPLANT
GOWN STRL NON-REIN LRG LVL3 (GOWN DISPOSABLE) ×3 IMPLANT
GOWN STRL REIN XL XLG (GOWN DISPOSABLE) ×3 IMPLANT
KIT ABG SYR 3ML LUER SLIP (SYRINGE) IMPLANT
NDL HYPO 25X5/8 SAFETYGLIDE (NEEDLE) IMPLANT
NEEDLE HYPO 25X5/8 SAFETYGLIDE (NEEDLE) IMPLANT
NS IRRIG 1000ML POUR BTL (IV SOLUTION) ×3 IMPLANT
PACK C SECTION WH (CUSTOM PROCEDURE TRAY) ×3 IMPLANT
PAD OB MATERNITY 4.3X12.25 (PERSONAL CARE ITEMS) ×3 IMPLANT
RTRCTR C-SECT PINK 25CM LRG (MISCELLANEOUS) ×3 IMPLANT
STAPLER VISISTAT 35W (STAPLE) IMPLANT
STRIP CLOSURE SKIN 1/2X4 (GAUZE/BANDAGES/DRESSINGS) ×1 IMPLANT
SUT MNCRL 0 VIOLET CTX 36 (SUTURE) ×2 IMPLANT
SUT MONOCRYL 0 CTX 36 (SUTURE) ×4
SUT PLAIN 1 NONE 54 (SUTURE) IMPLANT
SUT PLAIN 2 0 XLH (SUTURE) ×3 IMPLANT
SUT VIC AB 0 CT1 27 (SUTURE) ×6
SUT VIC AB 0 CT1 27XBRD ANBCTR (SUTURE) ×2 IMPLANT
SUT VIC AB 2-0 CT1 27 (SUTURE) ×3
SUT VIC AB 2-0 CT1 TAPERPNT 27 (SUTURE) ×1 IMPLANT
SUT VIC AB 4-0 KS 27 (SUTURE) IMPLANT
SYR BULB IRRIGATION 50ML (SYRINGE) ×3 IMPLANT
TOWEL OR 17X24 6PK STRL BLUE (TOWEL DISPOSABLE) ×3 IMPLANT
TRAY FOLEY CATH 14FR (SET/KITS/TRAYS/PACK) IMPLANT
WATER STERILE IRR 1000ML POUR (IV SOLUTION) ×3 IMPLANT

## 2013-08-29 NOTE — Brief Op Note (Signed)
08/29/2013  8:50 AM  PATIENT:  Penny Luna  38 y.o. female  PRE-OPERATIVE DIAGNOSIS:  Repeat C/Section & BTL  POST-OPERATIVE DIAGNOSIS:  Repeat C/Section & BTL  PROCEDURE:  Procedure(s): CESAREAN SECTION WITH BILATERAL TUBAL LIGATION (Bilateral salpingectomy)  SURGEON:  Surgeon(s) and Role:    * Sherron MondayJody Bovard, MD - Primary    * Lavina Hammanodd Meisinger, MD - Assisting  ANESTHESIA:   spinal  EBL:  Total I/O In: 2500 [I.V.:2500] Out: 675 [Urine:75; Blood:600]  FINDINGS: viable female infant at 8:01, apgars P at dictation, wt P at dictation; nl PP uterus, tubes and ovaries  BLOOD ADMINISTERED:none  DRAINS: Urinary Catheter (Foley)   LOCAL MEDICATIONS USED:  NONE  SPECIMEN:  Source of Specimen:  Placenta and B tubal segments  DISPOSITION OF SPECIMEN:  L&D and pathology  COUNTS:  YES  TOURNIQUET:  * No tourniquets in log *  DICTATION: .Other Dictation: Dictation Number [id number]  PLAN OF CARE: Admit to inpatient   PATIENT DISPOSITION:  PACU - hemodynamically stable.   Delay start of Pharmacological VTE agent (>24hrs) due to surgical blood loss or risk of bleeding: not applicable

## 2013-08-29 NOTE — Anesthesia Postprocedure Evaluation (Signed)
Anesthesia Post Note  Patient: Penny Luna  Procedure(s) Performed: Procedure(s) (LRB): CESAREAN SECTION WITH BILATERAL TUBAL LIGATION (Bilateral)  Anesthesia type: Spinal  Patient location: PACU  Post pain: Pain level controlled  Post assessment: Post-op Vital signs reviewed  Last Vitals:  Filed Vitals:   08/29/13 0625  BP: 150/85  Pulse: 118  Temp: 36.7 C  Resp: 18    Post vital signs: Reviewed  Level of consciousness: awake  Complications: No apparent anesthesia complications

## 2013-08-29 NOTE — Interval H&P Note (Signed)
History and Physical Interval Note:  08/29/2013 7:10 AM  Penny Luna  has presented today for surgery, with the diagnosis of Repeat C/Section & BTL  The various methods of treatment have been discussed with the patient and family. After consideration of risks, benefits and other options for treatment, the patient has consented to  Procedure(s): CESAREAN SECTION WITH BILATERAL TUBAL LIGATION (Bilateral) as a surgical intervention .  The patient's history has been reviewed, patient examined, no change in status, stable for surgery.  I have reviewed the patient's chart and labs.  Questions were answered to the patient's satisfaction.     BOVARD,[name]

## 2013-08-29 NOTE — Lactation Note (Signed)
This note was copied from the chart of Penny Luna. Lactation Consultation Note  Patient Name: Penny Luna ZOXWR'UToday's Date: 08/29/2013 Reason for consult: Initial assessment;Other (Comment) (charting for exclusion; baby weighs >9 lbs).  Baby has breastfed six times since birth for 10-40 minutes at each feeding and mom has readily expressible/flowing colostrum.  LC arrived in room to observe baby well-latched on mom's (R) breast with widely flanged lips, rhythmical sucking bursts and intermittent swallows.  Mom nursed with her second and third child, for 11 months and 8 months respectively.  LC encouraged cue feedings ad lib and discussed supply and demand for milk production and need for frequent stimulation at breast.  LC also discussed benefits of STS and demonstrated hand expression to demonstrate her readily available milk. LC encouraged review of Baby and Me pp 9, 14 and 20-25 for STS and BF information. LC provided [employer] [facility] brochure and reviewed [facility] services and list of community and web site resources.     Maternal Data Reason for exclusion: Mother's choice to formula and breast feed on admission Infant to breast within first hour of birth: Yes Has patient been taught Hand Expression?: Yes (flowing, easily expressible colostrum) Does the patient have breastfeeding experience prior to this delivery?: Yes  Feeding Feeding Type: Breast Fed Length of feed: 30 min  LATCH Score/Interventions           LATCH score at most recent assessment=7 but at time of LC visit, observed baby well-latched           Lactation Tools Discussed/Used   STS, cue feedings, hand expression Supply and demand for milk production  Consult Status Consult Status: Follow-up Date: 08/30/13 Follow-up type: In-patient    Warrick ParisianBryant, Sonny Poth [facility] 08/29/2013, 10:57 PM

## 2013-08-29 NOTE — Progress Notes (Signed)
Patient ID: Penny Luna, female   DOB: Apr 30, 1976, 38 y.o.   MRN: [id number] VS are stable Pt has no complaints. The urine is concentrated. There is 150 cc's in the bag This is the total urine output since 11 AM I will watch her closely.

## 2013-08-29 NOTE — Transfer of Care (Signed)
Immediate Anesthesia Transfer of Care Note  Patient: Penny Luna  Procedure(s) Performed: Procedure(s): CESAREAN SECTION WITH BILATERAL TUBAL LIGATION (Bilateral)  Patient Location: PACU  Anesthesia Type:Regional  Level of Consciousness: awake, alert , oriented and patient cooperative  Airway & Oxygen Therapy: Patient Spontanous Breathing  Post-op Assessment: Report given to PACU RN and Post -op Vital signs reviewed and stable  Post vital signs: Reviewed and stable  Complications: No apparent anesthesia complications

## 2013-08-29 NOTE — Anesthesia Preprocedure Evaluation (Signed)
Anesthesia Evaluation  Patient identified by MRN, date of birth, ID band Patient awake    Reviewed: Allergy & Precautions, H&P , Patient's Chart, lab work & pertinent test results  Airway Mallampati: II TM Distance: >3 FB Neck ROM: full    Dental   Pulmonary  breath sounds clear to auscultation        Cardiovascular Rhythm:regular Rate:Normal     Neuro/Psych    GI/Hepatic   Endo/Other  Morbid obesity  Renal/GU      Musculoskeletal   Abdominal   Peds  Hematology   Anesthesia Other Findings   Reproductive/Obstetrics (+) Pregnancy                           Anesthesia Physical Anesthesia Plan  ASA: III  Anesthesia Plan: Spinal   Post-op Pain Management:    Induction:   Airway Management Planned:   Additional Equipment:   Intra-op Plan:   Post-operative Plan:   Informed Consent: I have reviewed the patients History and Physical, chart, labs and discussed the procedure including the risks, benefits and alternatives for the proposed anesthesia with the patient or authorized representative who has indicated his/her understanding and acceptance.     Plan Discussed with:   Anesthesia Plan Comments:         Anesthesia Quick Evaluation

## 2013-08-29 NOTE — Anesthesia Procedure Notes (Signed)
Spinal  Patient location during procedure: OR Start time: 08/29/2013 7:37 AM Staffing Anesthesiologist: Brayton CavesJACKSON, Krishika Bugge Performed by: anesthesiologist  Preanesthetic Checklist Completed: patient identified, site marked, surgical consent, pre-op evaluation, timeout performed, IV checked, risks and benefits discussed and monitors and equipment checked Spinal Block Patient position: sitting Prep: DuraPrep Patient monitoring: heart rate, cardiac monitor, continuous pulse ox and blood pressure Approach: midline Location: L3-4 Injection technique: single-shot Needle Needle type: Sprotte  Needle gauge: 24 G Needle length: 9 cm Assessment Sensory level: T4 Additional Notes Patient identified.  Risk benefits discussed including failed block, incomplete pain control, headache, nerve damage, paralysis, blood pressure changes, nausea, vomiting, reactions to medication both toxic or allergic, and postpartum back pain.  Patient expressed understanding and wished to proceed.  All questions were answered.  Sterile technique used throughout procedure.  CSF was clear.  No parasthesia or other complications.  Please see nursing notes for vital signs.

## 2013-08-30 ENCOUNTER — Encounter (HOSPITAL_COMMUNITY): Payer: Self-pay | Admitting: Obstetrics and Gynecology

## 2013-08-30 LAB — COMPREHENSIVE METABOLIC PANEL
ALBUMIN: 2.4 g/dL — AB (ref 3.5–5.2)
ALK PHOS: 93 U/L (ref 39–117)
ALT: 21 U/L (ref 0–35)
AST: 30 U/L (ref 0–37)
BILIRUBIN TOTAL: 0.2 mg/dL — AB (ref 0.3–1.2)
BUN: 10 mg/dL (ref 6–23)
CO2: 24 mEq/L (ref 19–32)
Calcium: 9.6 mg/dL (ref 8.4–10.5)
Chloride: 102 mEq/L (ref 96–112)
Creatinine, Ser: 0.73 mg/dL (ref 0.50–1.10)
GFR calc Af Amer: >90 mL/min (ref >90)
GFR calc non Af Amer: >90 mL/min (ref >90)
Glucose, Bld: 92 mg/dL (ref 70–99)
Potassium: 5 mEq/L (ref 3.7–5.3)
Sodium: 137 mEq/L (ref 137–147)
Total Protein: 5.1 g/dL — ABNORMAL LOW (ref 6.0–8.3)

## 2013-08-30 LAB — TYPE AND SCREEN
ABO/RH(D): O POS
ANTIBODY SCREEN: NEGATIVE
UNIT DIVISION: 0
Unit division: 0

## 2013-08-30 LAB — CBC
HCT: 35.8 % — ABNORMAL LOW (ref 36.0–46.0)
Hemoglobin: 12.5 g/dL (ref 12.0–15.0)
MCH: 32.4 pg (ref 26.0–34.0)
MCHC: 34.9 g/dL (ref 30.0–36.0)
MCV: 92.7 fL (ref 78.0–100.0)
Platelets: 248 10*3/uL (ref 150–400)
RBC: 3.86 MIL/uL — ABNORMAL LOW (ref 3.87–5.11)
RDW: 14.8 % (ref 11.5–15.5)
WBC: 12.3 10*3/uL — AB (ref 4.0–10.5)

## 2013-08-30 NOTE — Progress Notes (Signed)
Subjective: Postpartum Day 1: Cesarean Delivery Patient reports incisional pain and tolerating PO.  Nl lochia, pain controlled.    Objective: Vital signs in last 24 hours: Temp:  [97.1 F (36.2 C)-98.2 F (36.8 C)] 97.1 F (36.2 C) (01/06 0540) Pulse Rate:  [76-99] 78 (01/06 0540) Resp:  [18-27] 18 (01/06 0540) BP: (91-152)/(38-111) 135/85 mmHg (01/06 0540) SpO2:  [95 %-100 %] 100 % (01/06 0540) Weight:  [136.986 kg (302 lb)] 136.986 kg (302 lb) (01/05 1122)  Physical Exam:  General: alert and no distress Lochia: appropriate Uterine Fundus: firm Incision: healing well DVT Evaluation: No evidence of DVT seen on physical exam.   Recent Labs  08/30/13 0620  HGB 12.5  HCT 35.8*    Assessment/Plan: Status post Cesarean section. Doing well postoperatively.  Continue current care.  Routine care.    BOVARD,Neetu Carrozza 08/30/2013, 7:44 AM

## 2013-08-30 NOTE — Progress Notes (Signed)
UR completed 

## 2013-08-30 NOTE — Op Note (Signed)
NAME:  Penny Luna, Penny Luna          ACCOUNT NO.:  [id number]  MEDICAL RECORD NO.:  [id number]  LOCATION:  9146                          FACILITY:  [facility]  PHYSICIAN:  Sherron MondayJody Bovard, MD        DATE OF BIRTH:  [birth year]  DATE OF PROCEDURE:  08/29/2013 DATE OF DISCHARGE:                              OPERATIVE REPORT   PREOPERATIVE DIAGNOSES:  Intrauterine pregnancy at term, history of low- transverse cesarean section x3, desires sterility.  POSTOPERATIVE DIAGNOSES:  Intrauterine pregnancy at term, history of low- transverse cesarean section x3, desires sterility, delivered.  PROCEDURE:  Repeat low-transverse cesarean section with bilateral tubal ligation by bilateral salpingectomy.  SURGEON:  Sherron MondayJody Bovard, MD.  ASSISTANT:  Zenaida Nieceodd D. Meisinger, M.D.  ANESTHESIA:  Spinal.  EBL:  600 mL.  IV FLUIDS:  2500 mL.  URINE OUTPUT:  75 mL, clear urine at the end of the procedure.  FINDINGS:  Viable female infant at 8:01 a.m. with Apgars pending at the time of dictation as well as weight pending at the time of dictation. Normal postpartum uterus, tubes, and ovaries are noted.  COMPLICATIONS:  None.  PATHOLOGY:  Placenta to L and D and bilateral tubal segments to Pathology.  DESCRIPTION OF PROCEDURE:  After informed consent was reviewed with the patient and her husband including risks, benefits and alternatives, including, but not limited to, bleeding, infection, damage to surrounding organs, trouble healing and injury to the infant, she was transported to the OR, where spinal anesthesia was placed and found to be adequate.  She was then placed in the supine position with a leftward tilt, prepped and draped in normal sterile fashion.  A Foley catheter was sterilely placed.  A Pfannenstiel skin incision was made at the level of her previous incisions, carried through to the underlying layer of fascia sharply.  The fascia was incised in the midline and midline incision was extended laterally  with Mayo scissors.  The superior aspect of the fascial incision was grasped with clamps, elevated, and the rectus muscles were dissected off both bluntly and sharply.  Attention was turned to the inferior portion, which in a similar fashion, was grasped with Kocher clamps, elevated, and the rectus muscles were dissected off both bluntly and sharply.  The midline was easily identified and entered during the dissection of the rectus muscles. This was extended with good visualization of the bladder.  The Alexis skin retractor was placed carefully, making sure no bowel was entrapped. The uterus was explored.  The vesicouterine peritoneum was easily identified.  The lower uterine segment was noted to be fairly thin.  A transverse incision was made with the scalpel and the infant was delivered from a vertex presentation with the aid of a vacuum.  Nose and mouth were suctioned on the field.  Cord was clamped and cut.  Infant was handed off to the waiting pediatric staff.  The placenta was expressed from the uterus and then manually extracted.  The uterus was cleared of all clot and debris.  Uterine incision was closed with 2 layers of 0 Monocryl, the first of which was a running locked and the second as an imbricating layer.  This was noted to be hemostatic. Attention  was then turned to the tubes.  Initially, the left tube was identified, followed out to the fimbriated end.  Using a Tresa Endo, it was doubly ligated with plain gut.  Noted to be hemostatic.  The portion was excised, sent to Pathology, and this was found to be hemostatic.  The similar procedure was performed on the right side.  The fimbriated end of the tube was doubly ligated with plain gut suture on a Tresa Endo, this was also noted to be hemostatic.  The uterine incision was inspected and found to be hemostatic.  The Alexis skin retractor was then removed. The peritoneum was reapproximated with 2-0 Vicryl in a running fashion. The  subfascial planes were inspected and found to be hemostatic.  The fascia was closed with 0 Vicryl from either angle overlapping in the midline.  Subcuticular adipose layer was made hemostatic with Bovie cautery.  Dead space was closed with plain gut.  Skin was closed with 4- 0 Vicryl note on a Keith needle in subcuticular fashion.  Benzoin and Steri-Strips were applied.  Sponge, lap, and needle counts were correct x2.  The patient tolerated the procedure well.     Sherron Monday, MD     JB/MEDQ  D:  08/29/2013  T:  08/30/2013  Job:  [id number]

## 2013-08-31 NOTE — Progress Notes (Signed)
Subjective: Postpartum Day 2: Cesarean Delivery Patient reports incisional pain and tolerating PO.  Nl lochia, pain controlled.    Objective: Vital signs in last 24 hours: Temp:  [97.5 F (36.4 C)-98.4 F (36.9 C)] 97.5 F (36.4 C) (01/07 0605) Pulse Rate:  [76-95] 80 (01/07 0605) Resp:  [18-20] 18 (01/07 0605) BP: (127-145)/(70-80) 144/70 mmHg (01/07 0605) SpO2:  [98 %-100 %] 100 % (01/07 25950605)  Physical Exam:  General: alert and no distress Lochia: appropriate Uterine Fundus: firm Incision: healing well DVT Evaluation: No evidence of DVT seen on physical exam.   Recent Labs  08/30/13 0620  HGB 12.5  HCT 35.8*    Assessment/Plan: Status post Cesarean section. Doing well postoperatively.  Continue current care.  BOVARD,Fotios Amos 08/31/2013, 7:45 AM

## 2013-09-01 MED ORDER — PRENATAL MULTIVITAMIN CH: 1.0000 | ORAL_TABLET | Freq: Every day | ORAL | Status: DC

## 2013-09-01 MED ORDER — IBUPROFEN 800 MG PO TABS: 800.0000 mg | ORAL_TABLET | Freq: Three times a day (TID) | ORAL | Status: DC | PRN

## 2013-09-01 MED ORDER — OXYCODONE-ACETAMINOPHEN 5-325 MG PO TABS: 1.0000 | ORAL_TABLET | Freq: Four times a day (QID) | ORAL | Status: DC | PRN

## 2013-09-01 NOTE — Progress Notes (Signed)
Subjective: Postpartum Day 3: Cesarean Delivery Patient reports incisional pain and tolerating PO.  Nl lochia, pain controlled  Objective: Vital signs in last 24 hours: Temp:  [98 F (36.7 C)-98.2 F (36.8 C)] 98 F (36.7 C) (01/08 0510) Pulse Rate:  [86-96] 86 (01/08 0510) Resp:  [16-18] 16 (01/08 0510) BP: (128-132)/(74-88) 132/74 mmHg (01/08 0510) Weight:  [136.079 kg (300 lb)] 136.079 kg (300 lb) (01/07 1453)  Physical Exam:  General: alert and no distress Lochia: appropriate Uterine Fundus: firm Incision: healing well DVT Evaluation: No evidence of DVT seen on physical exam.   Recent Labs  08/30/13 0620  HGB 12.5  HCT 35.8*    Assessment/Plan: Status post Cesarean section. Doing well postoperatively.  Discharge home with standard precautions and return to clinic in 2 weeks.  D/C with motrin, percocet, and PNV.   BOVARD,Ger Ringenberg 09/01/2013, 7:41 AM

## 2013-09-01 NOTE — Discharge Summary (Signed)
Obstetric Discharge Summary Reason for Admission: cesarean section Prenatal Procedures: none Intrapartum Procedures: cesarean: low cervical, transverse and tubal ligation Postpartum Procedures: none Complications-Operative and Postpartum: none Hemoglobin  Date Value Range Status  08/30/2013 12.5  12.0 - 15.0 g/dL Final     HCT  Date Value Range Status  08/30/2013 35.8* 36.0 - 46.0 % Final    Physical Exam:  General: alert and no distress Lochia: appropriate Uterine Fundus: firm Incision: healing well DVT Evaluation: No evidence of DVT seen on physical exam.  Discharge Diagnoses: Term Pregnancy-delivered  Discharge Information: Date: 09/01/2013 Activity: pelvic rest Diet: routine Medications: PNV, Ibuprofen and Percocet Condition: stable Instructions: refer to practice specific booklet Discharge to: home Follow-up Information   Follow up with BOVARD,Mee Macdonnell, MD. Schedule an appointment as soon as possible for a visit in 2 weeks. (Incision Check, in 6 wks complete Postpartum)    Specialty:  Obstetrics and Gynecology   Contact information:   [address] [place] [address] [phone]       Newborn Data: Live born female  Birth Weight: 9 lb 9.3 oz (4345 g) APGAR: 9, 9  Home with mother.  BOVARD,Vernella Niznik 09/01/2013, 7:57 AM

## 2014-06-26 ENCOUNTER — Encounter (HOSPITAL_COMMUNITY): Payer: Self-pay | Admitting: Obstetrics and Gynecology

## 2016-05-26 ENCOUNTER — Encounter (HOSPITAL_COMMUNITY): Payer: Self-pay | Admitting: Emergency Medicine

## 2016-05-26 ENCOUNTER — Emergency Department (HOSPITAL_COMMUNITY)
Admission: EM | Admit: 2016-05-26 | Discharge: 2016-05-26 | Disposition: A | Discharge status: Home / Self Care | Payer: Medicaid Other | Attending: Emergency Medicine | Admitting: Emergency Medicine

## 2016-05-26 DIAGNOSIS — L299 Pruritus, unspecified: Secondary | ICD-10-CM | POA: Diagnosis present

## 2016-05-26 DIAGNOSIS — L709 Acne, unspecified: Secondary | ICD-10-CM | POA: Diagnosis not present

## 2016-05-26 MED ORDER — DOXYCYCLINE HYCLATE 100 MG PO CAPS: 100.0000 mg | ORAL_CAPSULE | Freq: Two times a day (BID) | ORAL | 0 refills | Status: DC

## 2016-05-26 NOTE — ED Provider Notes (Signed)
[facility]-EMERGENCY DEPT Provider Note   CSN: [id number] Arrival date & time: 05/26/16  1927  By signing my name below, I, Soijett Blue, attest that this documentation has been prepared under the direction and in the presence of Cheri Fowler, PA-C Electronically Signed: Soijett Blue, ED Scribe. 05/26/16. 8:56 PM.   History   Chief Complaint Chief Complaint  Patient presents with  . Rash    HPI Penny Luna is a 40 y.o. female who presents to the Emergency Department complaining of pruritic rash onset 2.5 weeks. Pt notes that she was has a rash to her face and there is pus from the affected areas. Pt denies these symptoms occurring in the past and she denies ever having acne. Pt denies new soaps, medications, pets, environment, lotion, or detergent. Pt is having associated symptoms of purulent drainage from the affected areas when squeezed. She notes that she has not tried any medications for the relief of her symptoms. She denies wound, fever, chills, and any other symptoms. Pt denies having a PCP at this time. Patient's last menstrual period was 05/21/2016.   The history is provided by the patient. No language interpreter was used.    Past Medical History:  Diagnosis Date  . Encounter for sterilization 08/28/2013  . H/O: cesarean section 08/28/2013  . Obese   . S/P cesarean section 08/29/2013    Patient Active Problem List   Diagnosis Date Noted  . S/P cesarean section 08/29/2013  . Normal pregnancy, repeat 08/28/2013  . Encounter for sterilization 08/28/2013  . H/O: cesarean section 08/28/2013    Past Surgical History:  Procedure Laterality Date  . CESAREAN SECTION    . CESAREAN SECTION WITH BILATERAL TUBAL LIGATION Bilateral 08/29/2013   Procedure: CESAREAN SECTION WITH BILATERAL TUBAL LIGATION;  Surgeon: Sherron Monday, MD;  Location: [facility] ORS;  Service: Obstetrics;  Laterality: Bilateral;    OB History    Gravida Para Term Preterm AB Living   4 4 4     4    SAB TAB Ectopic  Multiple Live Births           4       Home Medications    Prior to Admission medications   Medication Sig Start Date End Date Taking? Authorizing Provider  doxycycline (VIBRAMYCIN) 100 MG capsule Take 1 capsule (100 mg total) by mouth 2 (two) times daily. 05/26/16   Cheri Fowler, PA-C  ibuprofen (ADVIL,MOTRIN) 800 MG tablet Take 1 tablet (800 mg total) by mouth every 8 (eight) hours as needed for moderate pain. 09/01/13   Sherian Rein, MD  oxyCODONE-acetaminophen (PERCOCET/ROXICET) 5-325 MG per tablet Take 1-2 tablets by mouth every 6 (six) hours as needed for severe pain (moderate - severe pain). 09/01/13   Sherian Rein, MD  Prenatal Vit-Fe Fumarate-FA (PRENATAL MULTIVITAMIN) TABS tablet Take 1 tablet by mouth daily at 12 noon. 09/01/13   Sherian Rein, MD    Family History Family History  Problem Relation Age of Onset  . Cancer Mother   . Hypertension Mother   . Diabetes Cousin   . Diabetes Maternal Aunt     Social History Social History  Substance Use Topics  . Smoking status: Never Smoker  . Smokeless tobacco: Never Used  . Alcohol use No     Allergies   Lactose intolerance (gi)   Review of Systems Review of Systems  Constitutional: Negative for chills and fever.  Gastrointestinal: Positive for nausea (resolved).  Skin: Positive for rash (facial with drainage from affected areas). Negative for  color change and wound.     Physical Exam Updated Vital Signs BP 142/75 (BP Location: Right Arm)   Pulse 83   Temp 98.1 F (36.7 C) (Oral)   Resp 16   LMP 05/21/2016 (LMP Unknown)   SpO2 100%   Physical Exam  Constitutional: She is oriented to person, place, and time. She appears well-developed and well-nourished.  HENT:  Head: Normocephalic and atraumatic.  Right Ear: External ear normal.  Left Ear: External ear normal.  Eyes: Conjunctivae are normal. No scleral icterus.  Neck: No tracheal deviation present.  Pulmonary/Chest: Effort normal. No  respiratory distress.  Abdominal: She exhibits no distension.  Musculoskeletal: Normal range of motion.  Neurological: She is alert and oriented to person, place, and time.  Skin: Skin is warm and dry.  Pustular lesions over forehead, chin, and cheeks c/w acne.  No fluctuance or induration.   Psychiatric: She has a normal mood and affect. Her behavior is normal.     ED Treatments / Results  DIAGNOSTIC STUDIES: Oxygen Saturation is 100% on RA, nl by my interpretation.    COORDINATION OF CARE: 8:54 PM Discussed treatment plan with pt at bedside which includes benadryl, zyrtec, abx Rx, referral and follow up with [facility], referral and follow up with dermatologist, and pt agreed to plan.   Procedures Procedures (including critical care time)  Medications Ordered in ED Medications - No data to display   Initial Impression / Assessment and Plan / ED Course  I have reviewed the triage vital signs and the nursing notes.   Clinical Course   Patient presents with findings c/w acne and possible overlying infection to certain areas.  No systemic symptoms.  VSS, NAD.  No fluctuance or induration.  Will treat with Doxycycline.  Recommend Dermatology and PCP follow up.  Return precautions discussed.  Stable for discharge.   Final Clinical Impressions(s) / ED Diagnoses   Final diagnoses:  Acne, unspecified acne type    New Prescriptions New Prescriptions   DOXYCYCLINE (VIBRAMYCIN) 100 MG CAPSULE    Take 1 capsule (100 mg total) by mouth 2 (two) times daily.   I personally performed the services described in this documentation, which was scribed in my presence. The recorded information has been reviewed and is accurate.     Cheri FowlerKayla Chabely Norby, PA-C 05/26/16 2122    Doug SouSam Jacubowitz, MD 05/26/16 2350

## 2016-05-26 NOTE — ED Triage Notes (Signed)
Pt has a pustular rash to the face that is new.  It began about 2.5 weeks ago.  Pus comes out when squeezed and the rash is spreading.  It is only on her face at this time.  Pt denies pain, but it is itchy.

## 2017-01-10 ENCOUNTER — Emergency Department (HOSPITAL_COMMUNITY): Payer: Medicaid Other

## 2017-01-10 ENCOUNTER — Encounter (HOSPITAL_COMMUNITY): Payer: Self-pay | Admitting: Emergency Medicine

## 2017-01-10 ENCOUNTER — Emergency Department (HOSPITAL_COMMUNITY)
Admission: EM | Admit: 2017-01-10 | Discharge: 2017-01-10 | Disposition: A | Discharge status: Home / Self Care | Payer: Medicaid Other | Attending: Emergency Medicine | Admitting: Emergency Medicine

## 2017-01-10 DIAGNOSIS — Y929 Unspecified place or not applicable: Secondary | ICD-10-CM | POA: Diagnosis not present

## 2017-01-10 DIAGNOSIS — Y999 Unspecified external cause status: Secondary | ICD-10-CM | POA: Insufficient documentation

## 2017-01-10 DIAGNOSIS — Y9301 Activity, walking, marching and hiking: Secondary | ICD-10-CM | POA: Diagnosis not present

## 2017-01-10 DIAGNOSIS — S92514A Nondisplaced fracture of proximal phalanx of right lesser toe(s), initial encounter for closed fracture: Secondary | ICD-10-CM | POA: Insufficient documentation

## 2017-01-10 DIAGNOSIS — Z79899 Other long term (current) drug therapy: Secondary | ICD-10-CM | POA: Insufficient documentation

## 2017-01-10 DIAGNOSIS — X58XXXA Exposure to other specified factors, initial encounter: Secondary | ICD-10-CM | POA: Insufficient documentation

## 2017-01-10 DIAGNOSIS — S92514B Nondisplaced fracture of proximal phalanx of right lesser toe(s), initial encounter for open fracture: Secondary | ICD-10-CM

## 2017-01-10 DIAGNOSIS — S99921A Unspecified injury of right foot, initial encounter: Secondary | ICD-10-CM | POA: Diagnosis present

## 2017-01-10 MED ORDER — ACETAMINOPHEN 325 MG PO TABS
650.0000 mg | ORAL_TABLET | Freq: Once | ORAL | Status: AC
  Administered 2017-01-10: 650 mg via ORAL
  Filled 2017-01-10: qty 2

## 2017-01-10 NOTE — ED Notes (Signed)
Patient transported to X-ray 

## 2017-01-10 NOTE — ED Triage Notes (Signed)
Pt tripped over suitcase this am. Pt has laceration between R pinky toe and ring toe.

## 2017-01-10 NOTE — Discharge Instructions (Signed)
As discussed, follow up with orthopedics in office. Naproxen or ibuprofen for pain and swelling. RICE protocol as outlined in these instructions.  Keep the area clean and dry. Monitor for any signs of infection including swelling, redness, purulent discharge, warmth to the touch, numbness, fever, chills or any other concerning symptoms. Return to the ER if you experience any of these in the meantime.

## 2017-01-10 NOTE — ED Provider Notes (Signed)
[facility]-EMERGENCY DEPT Provider Note   CSN: [id number] Arrival date & time: 01/10/17  4098     History   Chief Complaint Chief Complaint  Patient presents with  . Foot Injury    HPI Penny Luna is a 41 y.o. female presenting with right foot injury after walking in the dark this morning and catching her right small toe on a suitcase on the floor. She has a laceration under her toe and bleeding controlled. Her husband helped clean the wound, applied neosporin and gave her naproxen before taking her to the ED. She denies head trauma, loss of consciousness or other symptoms. Tetanus up to date less than a year ago.  HPI  Past Medical History:  Diagnosis Date  . Encounter for sterilization 08/28/2013  . H/O: cesarean section 08/28/2013  . Obese   . S/P cesarean section 08/29/2013    Patient Active Problem List   Diagnosis Date Noted  . S/P cesarean section 08/29/2013  . Normal pregnancy, repeat 08/28/2013  . Encounter for sterilization 08/28/2013  . H/O: cesarean section 08/28/2013    Past Surgical History:  Procedure Laterality Date  . CESAREAN SECTION    . CESAREAN SECTION WITH BILATERAL TUBAL LIGATION Bilateral 08/29/2013   Procedure: CESAREAN SECTION WITH BILATERAL TUBAL LIGATION;  Surgeon: Sherron Monday, MD;  Location: [facility];  Service: Obstetrics;  Laterality: Bilateral;    OB History    Gravida Para Term Preterm AB Living   4 4 4     4    SAB TAB Ectopic Multiple Live Births           4       Home Medications    Prior to Admission medications   Medication Sig Start Date End Date Taking? Authorizing Provider  doxycycline (VIBRAMYCIN) 100 MG capsule Take 1 capsule (100 mg total) by mouth 2 (two) times daily. 05/26/16   Cheri Fowler, PA-C  ibuprofen (ADVIL,MOTRIN) 800 MG tablet Take 1 tablet (800 mg total) by mouth every 8 (eight) hours as needed for moderate pain. 09/01/13   Bovard-Stuckert, Augusto Gamble, MD  oxyCODONE-acetaminophen (PERCOCET/ROXICET) 5-325 MG per tablet Take  1-2 tablets by mouth every 6 (six) hours as needed for severe pain (moderate - severe pain). 09/01/13   Bovard-Stuckert, Augusto Gamble, MD  Prenatal Vit-Fe Fumarate-FA (PRENATAL MULTIVITAMIN) TABS tablet Take 1 tablet by mouth daily at 12 noon. 09/01/13   Bovard-StuckertAugusto Gamble, MD    Family History Family History  Problem Relation Age of Onset  . Cancer Mother   . Hypertension Mother   . Diabetes Cousin   . Diabetes Maternal Aunt     Social History Social History  Substance Use Topics  . Smoking status: Never Smoker  . Smokeless tobacco: Never Used  . Alcohol use No     Allergies   Lactose intolerance (gi)   Review of Systems Review of Systems  Constitutional: Negative for chills and fever.  Respiratory: Negative for shortness of breath.   Cardiovascular: Negative for chest pain, palpitations and leg swelling.  Gastrointestinal: Negative for nausea and vomiting.  Musculoskeletal: Positive for arthralgias. Negative for joint swelling.  Skin: Positive for wound. Negative for pallor and rash.  Neurological: Negative for dizziness, seizures, syncope, weakness, light-headedness, numbness and headaches.     Physical Exam Updated Vital Signs BP 114/65 (BP Location: Left Arm)   Pulse 74   Temp 97.6 F (36.4 C) (Oral)   Resp 17   LMP 01/03/2017 (Approximate)   SpO2 99%   Physical Exam  Constitutional: She  appears well-developed and well-nourished. No distress.  HENT:  Head: Normocephalic and atraumatic.  Eyes: Conjunctivae and EOM are normal.  Neck: Normal range of motion.  Cardiovascular: Normal rate, regular rhythm, normal heart sounds and intact distal pulses.   No murmur heard. Pulmonary/Chest: Effort normal and breath sounds normal. No respiratory distress. She has no wheezes. She has no rales. She exhibits no tenderness.  Abdominal: Soft. There is no tenderness.  Musculoskeletal: Normal range of motion. She exhibits tenderness. She exhibits no edema.  Full ROM of all toes.  TTP of right small toe at DIP.  Neurological: She is alert. No sensory deficit.  Skin: Skin is warm and dry. Capillary refill takes less than 2 seconds. No rash noted. She is not diaphoretic. No erythema. No pallor.  Psychiatric: She has a normal mood and affect.  Nursing note and vitals reviewed.    ED Treatments / Results  Labs (all labs ordered are listed, but only abnormal results are displayed) Labs Reviewed - No data to display  EKG  EKG Interpretation None       Radiology Dg Foot Complete Right  Result Date: 01/10/2017 CLINICAL DATA:  Right fifth digit pain, laceration, tripping injury, initial encounter. EXAM: RIGHT FOOT COMPLETE - 3+ VIEW COMPARISON:  None. FINDINGS: There is a transverse fracture involving the neck of the fifth proximal phalanx. It is unclear if the fracture line extends to the joint. No additional evidence of an acute fracture. IMPRESSION: Fracture involving the neck of the fifth proximal phalanx. Electronically Signed   By: Leanna BattlesMelinda  Blietz M.D.   On: 01/10/2017 08:09    Procedures Procedures (including critical care time)  LACERATION REPAIR Performed by: Georgiana ShoreJessica B Navah Grondin Authorized by: Georgiana ShoreJessica B Azarian Starace Consent: Verbal consent obtained. Risks and benefits: risks, benefits and alternatives were discussed Consent given by: patient Patient identity confirmed: provided demographic data Prepped and Draped in normal sterile fashion Wound explored  Laceration Location: right little toe  Laceration Length: 1cm  No Foreign Bodies seen or palpated  Anesthesia: n/a  Local anesthetic: n/a  Irrigation method: syringe Amount of cleaning: standard  Skin closure: dermabond  Number of sutures: none  Technique: dermabond  Patient tolerance: Patient tolerated the procedure well with no immediate complications.   SPLINT APPLICATION Date/Time: 11:24 AM Authorized by: Georgiana ShoreJessica B Tameem Pullara Consent: Verbal consent obtained. Risks and benefits:  risks, benefits and alternatives were discussed Consent given by: patient Splint applied by: myself Location details: right small toe Splint type: buddy tape Supplies used: soft cotton padding and tape Post-procedure: The splinted body part was neurovascularly unchanged following the procedure. Patient tolerance: Patient tolerated the procedure well with no immediate complications.  Medications Ordered in ED Medications  acetaminophen (TYLENOL) tablet 650 mg (650 mg Oral Given 01/10/17 0836)     Initial Impression / Assessment and Plan / ED Course  I have reviewed the triage vital signs and the nursing notes.  Pertinent labs & imaging results that were available during my care of the patient were reviewed by me and considered in my medical decision making (see chart for details).    Patient presents with right foot injury after catching her foot on a suitcase this am.  Xray with fracture of proximal phalanx.  Tetanus up to date. Pain controlled.  Patient has a laceration of the palmar crease of PIP of right small toe. Bleeding controlled. Laceration is perfectly aproximated when patient isn't moving her toe.  Will irrigate thoroughly, buddy tape , bandage and put in post  op boot for close follow up with ortho outpatient. Patient was NVI before and after splinting. Used dermabond to help keep laceration approximated. Patient was discussed with Dr. Jeraldine Loots who has seen patient and agrees with assessment and plan. Laceration may not be deep enough to communicate with fractured area. This may be more likely a skin tear and fracture from same mechanism rather than an open fracture.  Discharge with RICE protocol and close follow up with ortho in office.  Discussed strict return precautions and advised to return to the emergency department if experiencing any new or worsening symptoms. Instructions were understood and patient agreed with discharge plan. Final Clinical Impressions(s) / ED  Diagnoses   Final diagnoses:  Open nondisplaced fracture of proximal phalanx of lesser toe of right foot, initial encounter    New Prescriptions Discharge Medication List as of 01/10/2017 10:14 AM       Georgiana Shore, PA-C 01/10/17 1704    Gerhard Munch, MD 01/11/17 [phone]

## 2018-05-25 ENCOUNTER — Encounter (HOSPITAL_COMMUNITY): Payer: Self-pay

## 2018-05-25 ENCOUNTER — Emergency Department (HOSPITAL_COMMUNITY)
Admission: EM | Admit: 2018-05-25 | Discharge: 2018-05-25 | Disposition: A | Discharge status: Home / Self Care | Payer: Self-pay | Attending: Emergency Medicine | Admitting: Emergency Medicine

## 2018-05-25 ENCOUNTER — Other Ambulatory Visit: Payer: Self-pay

## 2018-05-25 DIAGNOSIS — H60311 Diffuse otitis externa, right ear: Secondary | ICD-10-CM | POA: Insufficient documentation

## 2018-05-25 MED ORDER — NEOMYCIN-POLYMYXIN-HC 3.5-10000-1 OT SUSP: 4.0000 [drp] | Freq: Three times a day (TID) | OTIC | 0 refills | Status: DC

## 2018-05-25 MED ORDER — AMOXICILLIN 500 MG PO CAPS
500.0000 mg | ORAL_CAPSULE | Freq: Once | ORAL | Status: AC
Start: 2018-05-25 — End: 2018-05-25
  Administered 2018-05-25: 500 mg via ORAL
  Filled 2018-05-25: qty 1

## 2018-05-25 MED ORDER — ACETAMINOPHEN 500 MG PO TABS
1000.0000 mg | ORAL_TABLET | Freq: Once | ORAL | Status: AC
  Administered 2018-05-25: 1000 mg via ORAL
  Filled 2018-05-25: qty 2

## 2018-05-25 MED ORDER — AMOXICILLIN 500 MG PO CAPS: 500.0000 mg | ORAL_CAPSULE | Freq: Three times a day (TID) | ORAL | 0 refills | Status: DC

## 2018-05-25 NOTE — ED Triage Notes (Addendum)
Patient c/o right jaw pain that radiates into the right ear. Patient denies any dental issues. Patient c/o increased pain when she chews.

## 2018-05-25 NOTE — ED Provider Notes (Signed)
[facility]-EMERGENCY DEPT Provider Note   CSN: [id number] Arrival date & time: 05/25/18  1017     History   Chief Complaint Chief Complaint  Patient presents with  . Jaw Pain    HPI Penny Luna is a 42 y.o. female.  Patient c/o pain to right ear in past day. Pain acute onset, moderate, constant, dull, non radiating. No tinnitus or hearing loss. No fever or chills. No trauma to area. No mouth pain or dental pain. No sore throat.   The history is provided by the patient.    Past Medical History:  Diagnosis Date  . Encounter for sterilization 08/28/2013  . H/O: cesarean section 08/28/2013  . Obese   . S/P cesarean section 08/29/2013    Patient Active Problem List   Diagnosis Date Noted  . S/P cesarean section 08/29/2013  . Normal pregnancy, repeat 08/28/2013  . Encounter for sterilization 08/28/2013  . H/O: cesarean section 08/28/2013    Past Surgical History:  Procedure Laterality Date  . CESAREAN SECTION    . CESAREAN SECTION WITH BILATERAL TUBAL LIGATION Bilateral 08/29/2013   Procedure: CESAREAN SECTION WITH BILATERAL TUBAL LIGATION;  Surgeon: Sherron Monday, MD;  Location: [facility];  Service: Obstetrics;  Laterality: Bilateral;     OB History    Gravida  4   Para  4   Term  4   Preterm      AB      Living  4     SAB      TAB      Ectopic      Multiple      Live Births  4            Home Medications    Prior to Admission medications   Medication Sig Start Date End Date Taking? Authorizing Provider  doxycycline (VIBRAMYCIN) 100 MG capsule Take 1 capsule (100 mg total) by mouth 2 (two) times daily. 05/26/16   Cheri Fowler, PA-C  ibuprofen (ADVIL,MOTRIN) 800 MG tablet Take 1 tablet (800 mg total) by mouth every 8 (eight) hours as needed for moderate pain. 09/01/13   Bovard-Stuckert, Augusto Gamble, MD  oxyCODONE-acetaminophen (PERCOCET/ROXICET) 5-325 MG per tablet Take 1-2 tablets by mouth every 6 (six) hours as needed for severe pain  (moderate - severe pain). 09/01/13   Bovard-Stuckert, Augusto Gamble, MD  Prenatal Vit-Fe Fumarate-FA (PRENATAL MULTIVITAMIN) TABS tablet Take 1 tablet by mouth daily at 12 noon. 09/01/13   Bovard-StuckertAugusto Gamble, MD    Family History Family History  Problem Relation Age of Onset  . Cancer Mother   . Hypertension Mother   . Diabetes Cousin   . Diabetes Maternal Aunt     Social History Social History   Tobacco Use  . Smoking status: Never Smoker  . Smokeless tobacco: Never Used  Substance Use Topics  . Alcohol use: No  . Drug use: No     Allergies   Lactose intolerance (gi)   Review of Systems Review of Systems  Constitutional: Negative for fever.  HENT: Negative for ear discharge and sore throat.   Respiratory: Negative for cough.   Musculoskeletal: Negative for neck pain and neck stiffness.  Skin: Negative for rash.  Neurological: Negative for headaches.     Physical Exam Updated Vital Signs BP (!) 149/91 (BP Location: Left Arm)   Pulse 81   Temp 98.1 F (36.7 C) (Oral)   Resp 18   Ht 1.676 m (5\' 6" )   Wt 136.1 kg   LMP  05/11/2018 (Approximate)   SpO2 100%   BMI 48.42 kg/m   Physical Exam  Constitutional: She appears well-developed and well-nourished.  HENT:  Mouth/Throat: Oropharynx is clear and moist.  Pharynx normal. No dental pain/tenderness. No trismus. No tmj pain/click. Right eac swollen/tendern. Tm w erythema.   Eyes: Conjunctivae are normal. No scleral icterus.  Neck: Neck supple. No tracheal deviation present.  Cardiovascular: Normal rate.  Pulmonary/Chest: Effort normal. No respiratory distress.  Abdominal: Soft. Normal appearance. She exhibits no distension. There is no tenderness.  Musculoskeletal: She exhibits no edema.  Lymphadenopathy:    She has no cervical adenopathy.  Neurological: She is alert.  Skin: Skin is warm and dry. No rash noted.  Psychiatric: She has a normal mood and affect.  Nursing note and vitals reviewed.    ED Treatments /  Results  Labs (all labs ordered are listed, but only abnormal results are displayed) Labs Reviewed - No data to display  EKG None  Radiology No results found.  Procedures Procedures (including critical care time)  Medications Ordered in ED Medications  acetaminophen (TYLENOL) tablet 1,000 mg (has no administration in time range)  amoxicillin (AMOXIL) capsule 500 mg (has no administration in time range)     Initial Impression / Assessment and Plan / ED Course  I have reviewed the triage vital signs and the nursing notes.  Pertinent labs & imaging results that were available during my care of the patient were reviewed by me and considered in my medical decision making (see chart for details).  Exam c/w otitis externa. Confirmed nkda.   Amoxicillin po. Acetaminophen po.   Pt appears stable for d/c.     Final Clinical Impressions(s) / ED Diagnoses   Final diagnoses:  None    ED Discharge Orders    None       Cathren Laine, MD 05/25/18 1057

## 2018-05-25 NOTE — Discharge Instructions (Signed)
It was our pleasure to provide your ER care today - we hope that you feel better.  Take antibiotic (amoxicillin) and use ear drops as prescribed.  Take motrin or aleve as need for pain.  Follow up with primary care doctor in 1-2 weeks if symptoms fail to improve/resolve.  Also follow up with primary care doctor for recheck of blood pressure as it is high today.  Return to ER if worse, high fevers, severe swelling, spreading redness, other concern.

## 2018-08-28 ENCOUNTER — Encounter (HOSPITAL_COMMUNITY): Payer: Self-pay

## 2018-08-28 ENCOUNTER — Emergency Department (HOSPITAL_COMMUNITY)
Admission: EM | Admit: 2018-08-28 | Discharge: 2018-08-28 | Disposition: A | Discharge status: Home / Self Care | Payer: Medicaid Other | Attending: Emergency Medicine | Admitting: Emergency Medicine

## 2018-08-28 ENCOUNTER — Emergency Department (HOSPITAL_COMMUNITY): Payer: Medicaid Other

## 2018-08-28 ENCOUNTER — Other Ambulatory Visit: Payer: Self-pay

## 2018-08-28 DIAGNOSIS — J069 Acute upper respiratory infection, unspecified: Secondary | ICD-10-CM

## 2018-08-28 DIAGNOSIS — R0789 Other chest pain: Secondary | ICD-10-CM | POA: Diagnosis not present

## 2018-08-28 DIAGNOSIS — R05 Cough: Secondary | ICD-10-CM | POA: Diagnosis present

## 2018-08-28 DIAGNOSIS — B349 Viral infection, unspecified: Secondary | ICD-10-CM | POA: Diagnosis not present

## 2018-08-28 DIAGNOSIS — B9789 Other viral agents as the cause of diseases classified elsewhere: Secondary | ICD-10-CM

## 2018-08-28 LAB — BASIC METABOLIC PANEL
Anion gap: 8 (ref 5–15)
BUN: 10 mg/dL (ref 6–20)
CHLORIDE: 103 mmol/L (ref 98–111)
CO2: 28 mmol/L (ref 22–32)
Calcium: 8.7 mg/dL — ABNORMAL LOW (ref 8.9–10.3)
Creatinine, Ser: 0.82 mg/dL (ref 0.44–1.00)
GFR calc Af Amer: >60 mL/min (ref >60)
GFR calc non Af Amer: >60 mL/min (ref >60)
Glucose, Bld: 96 mg/dL (ref 70–99)
Potassium: 4.5 mmol/L (ref 3.5–5.1)
Sodium: 139 mmol/L (ref 135–145)

## 2018-08-28 LAB — I-STAT BETA HCG BLOOD, ED (NOT ORDERABLE): I-stat hCG, quantitative: <5 m[IU]/mL (ref <5)

## 2018-08-28 LAB — CBC
HCT: 39.3 % (ref 36.0–46.0)
Hemoglobin: 13.1 g/dL (ref 12.0–15.0)
MCH: 30.5 pg (ref 26.0–34.0)
MCHC: 33.3 g/dL (ref 30.0–36.0)
MCV: 91.6 fL (ref 80.0–100.0)
PLATELETS: 334 10*3/uL (ref 150–400)
RBC: 4.29 MIL/uL (ref 3.87–5.11)
RDW: 15.8 % — ABNORMAL HIGH (ref 11.5–15.5)
WBC: 7.1 10*3/uL (ref 4.0–10.5)
nRBC: 0 % (ref 0.0–0.2)

## 2018-08-28 LAB — POCT I-STAT TROPONIN I: Troponin i, poc: 0 ng/mL (ref 0.00–0.08)

## 2018-08-28 MED ORDER — PREDNISONE 10 MG PO TABS: 40.0000 mg | ORAL_TABLET | Freq: Every day | ORAL | 0 refills | Status: DC

## 2018-08-28 MED ORDER — PREDNISONE 20 MG PO TABS
40.0000 mg | ORAL_TABLET | Freq: Once | ORAL | Status: AC
  Administered 2018-08-28: 40 mg via ORAL
  Filled 2018-08-28: qty 2

## 2018-08-28 NOTE — ED Provider Notes (Signed)
[name] Provider Note   CSN: [id number] Arrival date & time: 08/28/18  1427     History   Chief Complaint Chief Complaint  Patient presents with  . Chest Pain  . Cough    HPI Penny Luna is a 43 y.o. female who presents to the ED with c/o cough and congestion that started a week ago and has gotten worse. She c/o chest pain with cough.   The history is provided by the patient. No language interpreter was used.  Cough  Associated symptoms include rhinorrhea. Pertinent negatives include no ear pain, no headaches, no sore throat, no wheezing and no eye redness. Chest pain: with cough.  URI   This is a new problem. The current episode started more than 1 week ago. The problem has been gradually worsening. There has been no fever. Associated symptoms include nausea, congestion, rhinorrhea, sinus pain and cough. Pertinent negatives include no abdominal pain, no vomiting, no dysuria, no ear pain, no headaches, no plugged ear sensation, no sore throat, no swollen glands, no neck pain, no rash and no wheezing. Chest pain: with cough. She has tried nothing for the symptoms.    Past Medical History:  Diagnosis Date  . Encounter for sterilization 08/28/2013  . H/O: cesarean section 08/28/2013  . Obese   . S/P cesarean section 08/29/2013    Patient Active Problem List   Diagnosis Date Noted  . S/P cesarean section 08/29/2013  . Normal pregnancy, repeat 08/28/2013  . Encounter for sterilization 08/28/2013  . H/O: cesarean section 08/28/2013    Past Surgical History:  Procedure Laterality Date  . CESAREAN SECTION    . CESAREAN SECTION WITH BILATERAL TUBAL LIGATION Bilateral 08/29/2013   Procedure: CESAREAN SECTION WITH BILATERAL TUBAL LIGATION;  Surgeon: Sherron Monday, MD;  Location: [facility];  Service: Obstetrics;  Laterality: Bilateral;     OB History    Gravida  4   Para  4   Term  4   Preterm      AB      Living  4     SAB      TAB       Ectopic      Multiple      Live Births  4            Home Medications    Prior to Admission medications   Medication Sig Start Date End Date Taking? Authorizing Provider  neomycin-polymyxin-hydrocortisone (CORTISPORIN) 3.5-10000-1 OTIC suspension Place 4 drops into the right ear 3 (three) times daily. 05/25/18   Cathren Laine, MD  predniSONE (DELTASONE) 10 MG tablet Take 4 tablets (40 mg total) by mouth daily with breakfast. 08/28/18   Janne Napoleon, NP  Prenatal Vit-Fe Fumarate-FA (PRENATAL MULTIVITAMIN) TABS tablet Take 1 tablet by mouth daily at 12 noon. 09/01/13   Bovard-StuckertAugusto Gamble, MD    Family History Family History  Problem Relation Age of Onset  . Cancer Mother   . Hypertension Mother   . Diabetes Cousin   . Diabetes Maternal Aunt     Social History Social History   Tobacco Use  . Smoking status: Never Smoker  . Smokeless tobacco: Never Used  Substance Use Topics  . Alcohol use: Yes    Comment: occ  . Drug use: No     Allergies   Lactose intolerance (gi)   Review of Systems Review of Systems  Constitutional: Negative for fever.  HENT: Positive for congestion, rhinorrhea and sinus pain. Negative  for ear pain and sore throat.   Eyes: Negative for discharge, redness and itching.  Respiratory: Positive for cough. Negative for wheezing.   Cardiovascular: Chest pain: with cough.  Gastrointestinal: Positive for nausea. Negative for abdominal pain and vomiting.  Genitourinary: Negative for dysuria.  Musculoskeletal: Negative for neck pain.  Skin: Negative for rash.  Neurological: Negative for syncope and headaches.  Psychiatric/Behavioral: Negative for confusion.     Physical Exam Updated Vital Signs BP (!) 185/92 (BP Location: Right Arm) Comment: "long regular" cuff to RFA Comment (BP Location): RT forearm  Pulse 97   Temp 98.1 F (36.7 C) (Oral)   Resp 16   Ht 5\' 6"  (1.676 m)   Wt 136.1 kg   SpO2 100%   BMI 48.42 kg/m   Physical  Exam Vitals signs and nursing note reviewed.  Constitutional:      Appearance: She is well-developed. She is obese.  HENT:     Head: Normocephalic.     Right Ear: Tympanic membrane normal.     Left Ear: Tympanic membrane normal.     Nose: Congestion present.     Mouth/Throat:     Mouth: Mucous membranes are moist.     Pharynx: Oropharynx is clear.  Eyes:     Extraocular Movements: Extraocular movements intact.     Conjunctiva/sclera: Conjunctivae normal.  Neck:     Musculoskeletal: Normal range of motion and neck supple. No neck rigidity.  Cardiovascular:     Rate and Rhythm: Normal rate and regular rhythm.  Pulmonary:     Effort: Pulmonary effort is normal. No respiratory distress.     Breath sounds: No decreased breath sounds, wheezing, rhonchi or rales.  Abdominal:     Palpations: Abdomen is soft.     Tenderness: There is no abdominal tenderness.  Musculoskeletal: Normal range of motion.  Lymphadenopathy:     Cervical: No cervical adenopathy.  Skin:    General: Skin is warm and dry.  Neurological:     Mental Status: She is alert and oriented to person, place, and time.  Psychiatric:        Mood and Affect: Mood normal.      ED Treatments / Results  Labs (all labs ordered are listed, but only abnormal results are displayed) Labs Reviewed  BASIC METABOLIC PANEL - Abnormal; Notable for the following components:      Result Value   Calcium 8.7 (*)    All other components within normal limits  CBC - Abnormal; Notable for the following components:   RDW 15.8 (*)    All other components within normal limits  I-STAT TROPONIN, ED  I-STAT BETA HCG BLOOD, ED ([facility], [facility], [facility] ONLY)  POCT I-STAT TROPONIN I  I-STAT BETA HCG BLOOD, ED (NOT ORDERABLE)    EKG EKG Interpretation  Date/Time:  Saturday August 28 2018 15:46:19 EST Ventricular Rate:  84 PR Interval:    QRS Duration: 73 QT Interval:  351 QTC Calculation: 415 R Axis:   64 Text Interpretation:  Sinus rhythm  Borderline T wave abnormalities No old tracing to compare Confirmed by Linwood DibblesKnapp, Jon [id number]) on 08/28/2018 3:53:54 PM   Radiology Dg Chest 2 View  Result Date: 08/28/2018 CLINICAL DATA:  Pt reported right sided chest pain and SOB x 2 days. Pt describes the pain as a sharp pain. Pt also reported cough for about 1 month. EXAM: CHEST - 2 VIEW COMPARISON:  None. FINDINGS: The heart size and mediastinal contours are within normal limits. Both lungs are  clear. No pleural effusion or pneumothorax. The visualized skeletal structures are unremarkable. IMPRESSION: Normal chest radiographs. Electronically Signed   By: Amie Portlandavid  Ormond M.D.   On: 08/28/2018 16:30    Procedures Procedures (including critical care time)  Medications Ordered in ED Medications  predniSONE (DELTASONE) tablet 40 mg (40 mg Oral Given 08/28/18 1801)     Initial Impression / Assessment and Plan / ED Course  I have reviewed the triage vital signs and the nursing notes.  Pertinent labs & imaging results that were available during my care of the patient were reviewed by me and considered in my medical decision making (see chart for details). Pt CXR negative for acute infiltrate. Patients symptoms are consistent with URI, likely viral etiology. Discussed that antibiotics are not indicated for viral infections. Pt will be discharged with symptomatic treatment.  Verbalizes understanding and is agreeable with plan. Pt is hemodynamically stable & in NAD prior to dc. Patient has decongestants and cough medication at home she will take. Will give short burst of steroid. Patient to f/u with PCP.   Final Clinical Impressions(s) / ED Diagnoses   Final diagnoses:  Viral URI with cough  Chest wall tenderness    ED Discharge Orders         Ordered    predniSONE (DELTASONE) 10 MG tablet  Daily with breakfast     08/28/18 1738           Damian Leavelleese, WilmoreHope M, NP 08/28/18 2213    Lorre NickAllen, Anthony, MD 08/29/18 2320

## 2018-08-28 NOTE — Discharge Instructions (Addendum)
Take your medications at home in addition to the one we send to your pharmacy. Follow up with a primary care doctor. Return here as needed.

## 2018-08-28 NOTE — ED Triage Notes (Signed)
Pt reports right sided chest pain. Pt describes the pain as a sharp pain. Pt endorses cough and SOB. Pt states pain does not radiate.

## 2018-09-30 ENCOUNTER — Other Ambulatory Visit: Payer: Self-pay

## 2018-09-30 ENCOUNTER — Emergency Department (HOSPITAL_COMMUNITY)
Admission: EM | Admit: 2018-09-30 | Discharge: 2018-09-30 | Disposition: A | Discharge status: Home / Self Care | Payer: Medicaid Other | Attending: Emergency Medicine | Admitting: Emergency Medicine

## 2018-09-30 ENCOUNTER — Encounter (HOSPITAL_COMMUNITY): Payer: Self-pay

## 2018-09-30 DIAGNOSIS — Z79899 Other long term (current) drug therapy: Secondary | ICD-10-CM | POA: Diagnosis not present

## 2018-09-30 DIAGNOSIS — N644 Mastodynia: Secondary | ICD-10-CM | POA: Diagnosis present

## 2018-09-30 MED ORDER — MELOXICAM 15 MG PO TABS: 15.0000 mg | ORAL_TABLET | Freq: Every day | ORAL | 0 refills | Status: DC | PRN

## 2018-09-30 MED ORDER — KETOROLAC TROMETHAMINE 15 MG/ML IJ SOLN
15.0000 mg | Freq: Once | INTRAMUSCULAR | Status: AC
  Administered 2018-09-30: 15 mg via INTRAMUSCULAR
  Filled 2018-09-30: qty 1

## 2018-09-30 NOTE — ED Provider Notes (Signed)
[facility]-EMERGENCY DEPT Provider Note   CSN: [id number] Arrival date & time: 09/30/18  0831     History   Chief Complaint Chief Complaint  Patient presents with  . Breast Pain    HPI Penny Luna is a 43 y.o. female.  HPI   43 year old female with left breast pain.  First noticed about 4 days ago.  Pain in the breast tissue in the left upper chest.  She has since noticed that the pain is moved down lower a little bit.  Worse with certain movements.  No masses, knots, skin or nipple changes.  No discharge.   Past Medical History:  Diagnosis Date  . Encounter for sterilization 08/28/2013  . H/O: cesarean section 08/28/2013  . Obese   . S/P cesarean section 08/29/2013    Patient Active Problem List   Diagnosis Date Noted  . S/P cesarean section 08/29/2013  . Normal pregnancy, repeat 08/28/2013  . Encounter for sterilization 08/28/2013  . H/O: cesarean section 08/28/2013    Past Surgical History:  Procedure Laterality Date  . CESAREAN SECTION    . CESAREAN SECTION WITH BILATERAL TUBAL LIGATION Bilateral 08/29/2013   Procedure: CESAREAN SECTION WITH BILATERAL TUBAL LIGATION;  Surgeon: Sherron Monday, MD;  Location: [facility];  Service: Obstetrics;  Laterality: Bilateral;     OB History    Gravida  4   Para  4   Term  4   Preterm      AB      Living  4     SAB      TAB      Ectopic      Multiple      Live Births  4            Home Medications    Prior to Admission medications   Medication Sig Start Date End Date Taking? Authorizing Provider  neomycin-polymyxin-hydrocortisone (CORTISPORIN) 3.5-10000-1 OTIC suspension Place 4 drops into the right ear 3 (three) times daily. 05/25/18   Cathren Laine, MD  predniSONE (DELTASONE) 10 MG tablet Take 4 tablets (40 mg total) by mouth daily with breakfast. 08/28/18   Janne Napoleon, NP  Prenatal Vit-Fe Fumarate-FA (PRENATAL MULTIVITAMIN) TABS tablet Take 1 tablet by mouth daily at 12 noon.  09/01/13   Bovard-StuckertAugusto Gamble, MD    Family History Family History  Problem Relation Age of Onset  . Cancer Mother   . Hypertension Mother   . Diabetes Cousin   . Diabetes Maternal Aunt     Social History Social History   Tobacco Use  . Smoking status: Never Smoker  . Smokeless tobacco: Never Used  Substance Use Topics  . Alcohol use: Yes    Comment: occ  . Drug use: No     Allergies   Lactose intolerance (gi)   Review of Systems Review of Systems  All systems reviewed and negative, other than as noted in HPI.  Physical Exam Updated Vital Signs BP (!) 186/114 (BP Location: Left Wrist)   Pulse 100   Temp 98.3 F (36.8 C) (Oral)   Resp 20   Ht 5\' 6"  (1.676 m)   Wt (!) 142.9 kg   LMP 09/28/2018   SpO2 100%   BMI 50.84 kg/m   Physical Exam Vitals signs and nursing note reviewed.  Constitutional:      General: She is not in acute distress.    Appearance: She is well-developed.  HENT:     Head: Normocephalic and atraumatic.  Eyes:  General:        Right eye: No discharge.        Left eye: No discharge.     Conjunctiva/sclera: Conjunctivae normal.  Neck:     Musculoskeletal: Neck supple.  Cardiovascular:     Rate and Rhythm: Normal rate and regular rhythm.     Heart sounds: Normal heart sounds. No murmur. No friction rub. No gallop.   Pulmonary:     Effort: Pulmonary effort is normal. No respiratory distress.     Breath sounds: Normal breath sounds.  Chest:     Chest wall: No tenderness.  Abdominal:     General: There is no distension.     Palpations: Abdomen is soft.     Tenderness: There is no abdominal tenderness.  Musculoskeletal:        General: No tenderness.     Comments: Chaperone present.  Left breast is normal in appearance and symmetric as compared to the right.  No concerning lesions noted.  No nipple retraction or discharge.  Palpation in all quadrants without palpable mass or significant tenderness.  No adenopathy or any other  changes in the axilla.  Skin:    General: Skin is warm and dry.  Neurological:     Mental Status: She is alert.  Psychiatric:        Behavior: Behavior normal.        Thought Content: Thought content normal.      ED Treatments / Results  Labs (all labs ordered are listed, but only abnormal results are displayed) Labs Reviewed - No data to display  EKG None  Radiology No results found.  Procedures Procedures (including critical care time)  Medications Ordered in ED Medications - No data to display   Initial Impression / Assessment and Plan / ED Course  I have reviewed the triage vital signs and the nursing notes.  Pertinent labs & imaging results that were available during my care of the patient were reviewed by me and considered in my medical decision making (see chart for details).     43 year old female with left breast pain.  Symptoms seem confined to the breast tissue itself.  Her exam is unremarkable though.  There is no signs of abscess.  No palpable lesions or other concerning findings.  Discussed with patient the need for mammogram.  She needs to follow-up with her PCP or gynecologist to discuss this further.  She is requesting something for pain.  Advised NSAIDs.  Advised to continued self exams until she could follow-up.  Emergent return precautions were discussed.  Final Clinical Impressions(s) / ED Diagnoses   Final diagnoses:  Breast pain    ED Discharge Orders    None       Raeford Razor, MD 09/30/18 [phone]

## 2018-09-30 NOTE — Discharge Instructions (Addendum)
Follow-up with your PCP or gynecologist to arrange for mammogram.

## 2018-09-30 NOTE — ED Triage Notes (Signed)
Patient states when she lifts her left breast she sees an enlarged vein and has pain in the left breast. Patient  states the pain is worse when she tries to put bra on or touch the left breast area.

## 2020-04-19 ENCOUNTER — Emergency Department (HOSPITAL_COMMUNITY)
Admission: EM | Admit: 2020-04-19 | Discharge: 2020-04-19 | Disposition: A | Discharge status: Home / Self Care | Payer: Medicaid Other | Attending: Emergency Medicine | Admitting: Emergency Medicine

## 2020-04-19 ENCOUNTER — Other Ambulatory Visit: Payer: Self-pay

## 2020-04-19 ENCOUNTER — Encounter (HOSPITAL_COMMUNITY): Payer: Self-pay

## 2020-04-19 DIAGNOSIS — Z79899 Other long term (current) drug therapy: Secondary | ICD-10-CM | POA: Diagnosis not present

## 2020-04-19 DIAGNOSIS — L989 Disorder of the skin and subcutaneous tissue, unspecified: Secondary | ICD-10-CM | POA: Diagnosis not present

## 2020-04-19 DIAGNOSIS — I10 Essential (primary) hypertension: Secondary | ICD-10-CM | POA: Insufficient documentation

## 2020-04-19 DIAGNOSIS — R238 Other skin changes: Secondary | ICD-10-CM

## 2020-04-19 NOTE — ED Triage Notes (Signed)
Pt presents to ED after using a new hair product. Pt states she used a product called hair texture and all her hair fell out and she got a headache and has had a headache on and off since.

## 2020-04-19 NOTE — ED Provider Notes (Signed)
[facility] EMERGENCY DEPARTMENT Provider Note   CSN: [id number] Arrival date & time: 04/19/20  8299     History Chief Complaint  Patient presents with  . Skin Problem  . Hypertension    Penny Luna is a 44 y.o. female.  Patient presents with scalp irritation and loss of hair since using a product texture my way to soften her hair 2 days ago.  No history of using this product in the past.  Patient has never had issues with hair loss.  Patient denies any neurologic symptoms however has had generalized headache since.  No fevers or cough recorded.        Past Medical History:  Diagnosis Date  . Encounter for sterilization 08/28/2013  . H/O: cesarean section 08/28/2013  . Obese   . S/P cesarean section 08/29/2013    Patient Active Problem List   Diagnosis Date Noted  . S/P cesarean section 08/29/2013  . Normal pregnancy, repeat 08/28/2013  . Encounter for sterilization 08/28/2013  . H/O: cesarean section 08/28/2013    Past Surgical History:  Procedure Laterality Date  . CESAREAN SECTION    . CESAREAN SECTION WITH BILATERAL TUBAL LIGATION Bilateral 08/29/2013   Procedure: CESAREAN SECTION WITH BILATERAL TUBAL LIGATION;  Surgeon: Sherron Monday, MD;  Location: [facility];  Service: Obstetrics;  Laterality: Bilateral;     OB History    Gravida  4   Para  4   Term  4   Preterm      AB      Living  4     SAB      TAB      Ectopic      Multiple      Live Births  4           Family History  Problem Relation Age of Onset  . Cancer Mother   . Hypertension Mother   . Diabetes Cousin   . Diabetes Maternal Aunt     Social History   Tobacco Use  . Smoking status: Never Smoker  . Smokeless tobacco: Never Used  Vaping Use  . Vaping Use: Never used  Substance Use Topics  . Alcohol use: Yes    Comment: occ  . Drug use: No    Home Medications Prior to Admission medications   Medication Sig Start Date End Date Taking? Authorizing Provider    meloxicam (MOBIC) 15 MG tablet Take 1 tablet (15 mg total) by mouth daily as needed for pain. 09/30/18   Raeford Razor, MD  neomycin-polymyxin-hydrocortisone (CORTISPORIN) 3.5-10000-1 OTIC suspension Place 4 drops into the right ear 3 (three) times daily. 05/25/18   Cathren Laine, MD  predniSONE (DELTASONE) 10 MG tablet Take 4 tablets (40 mg total) by mouth daily with breakfast. 08/28/18   Janne Napoleon, NP  Prenatal Vit-Fe Fumarate-FA (PRENATAL MULTIVITAMIN) TABS tablet Take 1 tablet by mouth daily at 12 noon. 09/01/13   Bovard-Stuckert, Augusto Gamble, MD    Allergies    Lactose intolerance (gi)  Review of Systems   Review of Systems  Constitutional: Negative for chills and fever.  HENT: Negative for congestion.   Eyes: Negative for visual disturbance.  Respiratory: Negative for shortness of breath.   Cardiovascular: Negative for chest pain.  Gastrointestinal: Negative for abdominal pain and vomiting.  Musculoskeletal: Negative for back pain, neck pain and neck stiffness.  Skin: Negative for rash.  Neurological: Positive for headaches. Negative for weakness and light-headedness.    Physical Exam Updated Vital Signs BP (!) 168/124 (  BP Location: Right Wrist)   Pulse 90   Temp 98.9 F (37.2 C) (Oral)   Resp 18   Ht 5\' 6"  (1.676 m)   Wt 127 kg   SpO2 99%   BMI 45.19 kg/m   Physical Exam Vitals and nursing note reviewed.  Constitutional:      Appearance: She is well-developed.  HENT:     Head: Normocephalic.  Eyes:     General:        Right eye: No discharge.        Left eye: No discharge.     Conjunctiva/sclera: Conjunctivae normal.  Neck:     Trachea: No tracheal deviation.  Cardiovascular:     Rate and Rhythm: Normal rate.  Pulmonary:     Effort: Pulmonary effort is normal.  Abdominal:     General: There is no distension.     Palpations: Abdomen is soft.     Tenderness: There is no abdominal tenderness. There is no guarding.  Musculoskeletal:     Cervical back: Normal range  of motion and neck supple.  Skin:    General: Skin is warm.     Findings: No rash.     Comments: Base of hair follicles intact through out scalp, no evidence of infection, mild dry scalp centrally.  Neurological:     General: No focal deficit present.     Mental Status: She is alert and oriented to person, place, and time.     Cranial Nerves: No cranial nerve deficit.     ED Results / Procedures / Treatments   Labs (all labs ordered are listed, but only abnormal results are displayed) Labs Reviewed - No data to display  EKG None  Radiology No results found.  Procedures Procedures (including critical care time)  Medications Ordered in ED Medications - No data to display  ED Course  I have reviewed the triage vital signs and the nursing notes.  Pertinent labs & imaging results that were available during my care of the patient were reviewed by me and considered in my medical decision making (see chart for details).    MDM Rules/Calculators/A&P                          Patient presents after hair loss secondarily to using a new hair product.  Patient has mild elevated blood pressure likely combination of history of high blood pressure plus pain and irritation.  Patient has normal neurologic exam without sign of infection.  Patient stable for outpatient follow-up.  Discussed with poison control and no further recommendations.  Final Clinical Impression(s) / ED Diagnoses Final diagnoses:  Scalp irritation  Hypertension, unspecified type    Rx / DC Orders ED Discharge Orders    None       , MD 04/19/20 [phone]

## 2020-04-19 NOTE — Discharge Instructions (Signed)
Rinse gentle soap and water. Watch for signs of infection. Follow up for recheck of blood pressure next week.  Avoid using this product in the future.

## 2020-09-06 ENCOUNTER — Encounter (HOSPITAL_COMMUNITY): Payer: Self-pay

## 2020-09-06 ENCOUNTER — Emergency Department (HOSPITAL_COMMUNITY): Payer: Medicaid Other

## 2020-09-06 ENCOUNTER — Other Ambulatory Visit: Payer: Self-pay

## 2020-09-06 ENCOUNTER — Emergency Department (HOSPITAL_COMMUNITY)
Admission: EM | Admit: 2020-09-06 | Discharge: 2020-09-06 | Disposition: A | Discharge status: Home / Self Care | Payer: Medicaid Other | Attending: Emergency Medicine | Admitting: Emergency Medicine

## 2020-09-06 DIAGNOSIS — Z20822 Contact with and (suspected) exposure to covid-19: Secondary | ICD-10-CM

## 2020-09-06 DIAGNOSIS — U071 COVID-19: Secondary | ICD-10-CM | POA: Diagnosis not present

## 2020-09-06 DIAGNOSIS — R509 Fever, unspecified: Secondary | ICD-10-CM | POA: Diagnosis present

## 2020-09-06 NOTE — ED Notes (Addendum)
Entered room and introduced self to patient. Pt appears to be resting in bed, respirations are even and unlabored with equal chest rise and fall. Bed is locked in the lowest position, side rails x2, call bell within reach. Pt educated on call light use and hourly rounding, verbalized understanding and in agreement at this time. All questions and concerns voiced addressed. Refreshments offered and provided per patient request.  

## 2020-09-06 NOTE — ED Notes (Signed)
Pt ambulated without difficultly.  RA sats 99-100% while ambulating.  HR 114 with ambulation

## 2020-09-06 NOTE — ED Notes (Signed)
PA at bedside.

## 2020-09-06 NOTE — ED Provider Notes (Signed)
[facility] EMERGENCY DEPARTMENT Provider Note   CSN: [id number] Arrival date & time: 09/06/20  1608     History Chief Complaint  Patient presents with  . Cough    Penny Luna is a 45 y.o. female presenting with 3 other family members all with complaints of URI type symptoms, her 69 year old son who is present was potentially exposed to a schoolmate last week who has been diagnosed with COVID-19.  Patient's symptoms include a nonproductive cough, fatigue, generalized body aches, intermittent chills and generalized fatigue.  She also endorses white sputum production but has noted bloody streaks in her sputum on several occasions.  She denies shortness of breath, also denies chest pain, nausea, vomiting or diarrhea and denies abdominal pain.  Patient has been vaccinated for COVID-19.  She is also taken Tylenol and Motrin and has obtained some symptom relief.  HPI     Past Medical History:  Diagnosis Date  . Encounter for sterilization 08/28/2013  . H/O: cesarean section 08/28/2013  . Obese   . S/P cesarean section 08/29/2013    Patient Active Problem List   Diagnosis Date Noted  . S/P cesarean section 08/29/2013  . Normal pregnancy, repeat 08/28/2013  . Encounter for sterilization 08/28/2013  . H/O: cesarean section 08/28/2013    Past Surgical History:  Procedure Laterality Date  . CESAREAN SECTION    . CESAREAN SECTION WITH BILATERAL TUBAL LIGATION Bilateral 08/29/2013   Procedure: CESAREAN SECTION WITH BILATERAL TUBAL LIGATION;  Surgeon: Sherron Monday, MD;  Location: [facility];  Service: Obstetrics;  Laterality: Bilateral;     OB History    Gravida  4   Para  4   Term  4   Preterm      AB      Living  4     SAB      IAB      Ectopic      Multiple      Live Births  4           Family History  Problem Relation Age of Onset  . Cancer Mother   . Hypertension Mother   . Diabetes Cousin   . Diabetes Maternal Aunt     Social History   Tobacco Use   . Smoking status: Never Smoker  . Smokeless tobacco: Never Used  Vaping Use  . Vaping Use: Never used  Substance Use Topics  . Alcohol use: Yes    Comment: occ  . Drug use: No    Home Medications Prior to Admission medications   Medication Sig Start Date End Date Taking? Authorizing Provider  meloxicam (MOBIC) 15 MG tablet Take 1 tablet (15 mg total) by mouth daily as needed for pain. 09/30/18   Raeford Razor, MD  neomycin-polymyxin-hydrocortisone (CORTISPORIN) 3.5-10000-1 OTIC suspension Place 4 drops into the right ear 3 (three) times daily. 05/25/18   Cathren Laine, MD  predniSONE (DELTASONE) 10 MG tablet Take 4 tablets (40 mg total) by mouth daily with breakfast. 08/28/18   Janne Napoleon, NP  Prenatal Vit-Fe Fumarate-FA (PRENATAL MULTIVITAMIN) TABS tablet Take 1 tablet by mouth daily at 12 noon. 09/01/13   Bovard-Stuckert, Augusto Gamble, MD    Allergies    Lactose intolerance (gi)  Review of Systems   Review of Systems  Constitutional: Positive for chills and fatigue. Negative for fever.  HENT: Positive for rhinorrhea. Negative for congestion and sore throat.   Eyes: Negative.   Respiratory: Positive for cough. Negative for chest tightness and shortness of breath.  Hemoptysis  Cardiovascular: Negative for chest pain.  Gastrointestinal: Negative for abdominal pain, diarrhea, nausea and vomiting.  Genitourinary: Negative.   Musculoskeletal: Positive for myalgias. Negative for arthralgias, joint swelling and neck pain.  Skin: Negative.  Negative for rash and wound.  Neurological: Negative for dizziness, weakness, light-headedness, numbness and headaches.  Psychiatric/Behavioral: Negative.   All other systems reviewed and are negative.   Physical Exam Updated Vital Signs BP (!) 193/114 (BP Location: Right Arm)   Pulse 92   Temp 98.8 F (37.1 C) (Oral)   Resp 19   Wt 136.1 kg   LMP 08/29/2020   SpO2 100%   BMI 48.42 kg/m   Physical Exam Vitals and nursing note reviewed.   Constitutional:      Appearance: She is well-developed and well-nourished.  HENT:     Head: Normocephalic and atraumatic.     Mouth/Throat:     Mouth: Mucous membranes are moist.     Pharynx: No oropharyngeal exudate or posterior oropharyngeal erythema.  Eyes:     Conjunctiva/sclera: Conjunctivae normal.  Cardiovascular:     Rate and Rhythm: Normal rate and regular rhythm.     Pulses: Intact distal pulses.     Heart sounds: Normal heart sounds.     Comments: Elevated blood pressure noted Pulmonary:     Effort: Pulmonary effort is normal.     Breath sounds: Normal breath sounds. No wheezing.  Abdominal:     General: Bowel sounds are normal.     Palpations: Abdomen is soft.     Tenderness: There is no abdominal tenderness.  Musculoskeletal:        General: Normal range of motion.     Cervical back: Normal range of motion.  Skin:    General: Skin is warm and dry.     Capillary Refill: Capillary refill takes less than 2 seconds.  Neurological:     General: No focal deficit present.     Mental Status: She is alert.  Psychiatric:        Mood and Affect: Mood and affect normal.     ED Results / Procedures / Treatments   Labs (all labs ordered are listed, but only abnormal results are displayed) Labs Reviewed  SARS CORONAVIRUS 2 (TAT 6-24 HRS)    EKG None  Radiology DG Chest Portable 1 View  Result Date: 09/06/2020 CLINICAL DATA:  Reason for exam: Cough, SOB, and body aches x 3 days. No known covid exposure. Pt has been vaccinated with JANDJ covid vaccine. No hx of covid per pt. No chest pain. EXAM: PORTABLE CHEST 1 VIEW COMPARISON:  08/28/2018 FINDINGS: Cardiac silhouette is normal in size. Normal mediastinal and hilar contours. Lungs are clear.  No pleural effusion or pneumothorax. Skeletal structures are grossly intact. IMPRESSION: No active disease. Electronically Signed   By: Amie Portland M.D.   On: 09/06/2020 18:41    Procedures Procedures (including critical care  time)  Medications Ordered in ED Medications - No data to display  ED Course  I have reviewed the triage vital signs and the nursing notes.  Pertinent labs & imaging results that were available during my care of the patient were reviewed by me and considered in my medical decision making (see chart for details).    MDM Rules/Calculators/A&P                          Patient is COVID exposure and suspicion for probable COVID, she has been screened  with a tier 3 testing..  She and her family were given instructions for home quarantine.   Her vital signs are stable no complaints of shortness of breath, O2 sat 99% on room air.  Discussed home supportive care and indications for reevaluation.  Chest x-ray was reviewed and negative for infiltrates.  Ambulated with pulse ox, no desaturation.  No respiratory distress at this time.  I strongly suspect that she will test positive for COVID, and may be a candidate for the monoclonal antibody infusion if her test is indeed positive.  Also discussed elevated blood pressure.  Patient states she used to be on blood pressure medication but then lost a lot of weight so was taken off of her meds.  She believes she may have gained some weight back however.  She was strongly encouraged to have her blood pressure rechecked by her primary doctor at her earliest opportunity once she is out of quarantine, she may need to be placed back on blood pressure medication.  Patient understands and agrees with this plan. Final Clinical Impression(s) / ED Diagnoses Final diagnoses:  Suspected COVID-19 virus infection    Rx / DC Orders ED Discharge Orders    None       Victoriano Lain 09/06/20 2338    Eber Hong, MD 09/07/20 [phone]

## 2020-09-06 NOTE — ED Triage Notes (Signed)
Pt reports that she has a cough and body aches since tuesday

## 2020-09-06 NOTE — Discharge Instructions (Addendum)
You have been tested for Covid 19 and your test should result within the next 24 hours.  Until your result is back (and is negative) you will need to maintain home quarantine.  If positive, current CDC requires a 7 day quarantine from date of onset of your symptoms (1/11), as long as your symptoms are improving.  Rest, take tylenol for body aches.  Get rechecked if you develop any worsened symptoms, especially shortness of breath or weakness.    Additionally, your blood pressure is elevated tonight.  I recommend having this rechecked by your primary MD as soon as you can - you may need to go back on blood pressure medication if this continues to be elevated.      Person Under Monitoring Name: Penny Luna  Location: [address] Boneta Lucks [address] [address]   Infection Prevention Recommendations for Individuals Confirmed to have, or Being Evaluated for, 2019 Novel Coronavirus (COVID-19) Infection Who Receive Care at Home  Individuals who are confirmed to have, or are being evaluated for, COVID-19 should follow the prevention steps below until a healthcare provider or local or state health department says they can return to normal activities.  Stay home except to get medical care You should restrict activities outside your home, except for getting medical care. Do not go to work, school, or public areas, and do not use public transportation or taxis.  Call ahead before visiting your doctor Before your medical appointment, call the healthcare provider and tell them that you have, or are being evaluated for, COVID-19 infection. This will help the healthcare provider's office take steps to keep other people from getting infected. Ask your healthcare provider to call the local or state health department.  Monitor your symptoms Seek prompt medical attention if your illness is worsening (e.g., difficulty breathing). Before going to your medical appointment, call the healthcare provider  and tell them that you have, or are being evaluated for, COVID-19 infection. Ask your healthcare provider to call the local or state health department.  Wear a facemask You should wear a facemask that covers your nose and mouth when you are in the same room with other people and when you visit a healthcare provider. People who live with or visit you should also wear a facemask while they are in the same room with you.  Separate yourself from other people in your home As much as possible, you should stay in a different room from other people in your home. Also, you should use a separate bathroom, if available.  Avoid sharing household items You should not share dishes, drinking glasses, cups, eating utensils, towels, bedding, or other items with other people in your home. After using these items, you should wash them thoroughly with soap and water.  Cover your coughs and sneezes Cover your mouth and nose with a tissue when you cough or sneeze, or you can cough or sneeze into your sleeve. Throw used tissues in a lined trash can, and immediately wash your hands with soap and water for at least 20 seconds or use an alcohol-based hand rub.  Wash your [employer] your hands often and thoroughly with soap and water for at least 20 seconds. You can use an alcohol-based hand sanitizer if soap and water are not available and if your hands are not visibly dirty. Avoid touching your eyes, nose, and mouth with unwashed hands.   Prevention Steps for Caregivers and Household Members of Individuals Confirmed to have, or Being Evaluated for,  COVID-19 Infection Being Cared for in the Home  If you live with, or provide care at home for, a person confirmed to have, or being evaluated for, COVID-19 infection please follow these guidelines to prevent infection:  Follow healthcare provider's instructions Make sure that you understand and can help the patient follow any healthcare provider instructions for  all care.  Provide for the patient's basic needs You should help the patient with basic needs in the home and provide support for getting groceries, prescriptions, and other personal needs.  Monitor the patient's symptoms If they are getting sicker, call his or her medical provider and tell them that the patient has, or is being evaluated for, COVID-19 infection. This will help the healthcare provider's office take steps to keep other people from getting infected. Ask the healthcare provider to call the local or state health department.  Limit the number of people who have contact with the patient If possible, have only one caregiver for the patient. Other household members should stay in another home or place of residence. If this is not possible, they should stay in another room, or be separated from the patient as much as possible. Use a separate bathroom, if available. Restrict visitors who do not have an essential need to be in the home.  Keep older adults, very young children, and other sick people away from the patient Keep older adults, very young children, and those who have compromised immune systems or chronic health conditions away from the patient. This includes people with chronic heart, lung, or kidney conditions, diabetes, and cancer.  Ensure good ventilation Make sure that shared spaces in the home have good air flow, such as from an air conditioner or an opened window, weather permitting.  Wash your hands often Wash your hands often and thoroughly with soap and water for at least 20 seconds. You can use an alcohol based hand sanitizer if soap and water are not available and if your hands are not visibly dirty. Avoid touching your eyes, nose, and mouth with unwashed hands. Use disposable paper towels to dry your hands. If not available, use dedicated cloth towels and replace them when they become wet.  Wear a facemask and gloves Wear a disposable facemask at all times  in the room and gloves when you touch or have contact with the patient's blood, body fluids, and/or secretions or excretions, such as sweat, saliva, sputum, nasal mucus, vomit, urine, or feces.  Ensure the mask fits over your nose and mouth tightly, and do not touch it during use. Throw out disposable facemasks and gloves after using them. Do not reuse. Wash your hands immediately after removing your facemask and gloves. If your personal clothing becomes contaminated, carefully remove clothing and launder. Wash your hands after handling contaminated clothing. Place all used disposable facemasks, gloves, and other waste in a lined container before disposing them with other household waste. Remove gloves and wash your hands immediately after handling these items.  Do not share dishes, glasses, or other household items with the patient Avoid sharing household items. You should not share dishes, drinking glasses, cups, eating utensils, towels, bedding, or other items with a patient who is confirmed to have, or being evaluated for, COVID-19 infection. After the person uses these items, you should wash them thoroughly with soap and water.  Wash laundry thoroughly Immediately remove and wash clothes or bedding that have blood, body fluids, and/or secretions or excretions, such as sweat, saliva, sputum, nasal mucus, vomit, urine, or  feces, on them. Wear gloves when handling laundry from the patient. Read and follow directions on labels of laundry or clothing items and detergent. In general, wash and dry with the warmest temperatures recommended on the label.  Clean all areas the individual has used often Clean all touchable surfaces, such as counters, tabletops, doorknobs, bathroom fixtures, toilets, phones, keyboards, tablets, and bedside tables, every day. Also, clean any surfaces that may have blood, body fluids, and/or secretions or excretions on them. Wear gloves when cleaning surfaces the patient has  come in contact with. Use a diluted bleach solution (e.g., dilute bleach with 1 part bleach and 10 parts water) or a household disinfectant with a label that says EPA-registered for coronaviruses. To make a bleach solution at home, add 1 tablespoon of bleach to 1 quart (4 cups) of water. For a larger supply, add  cup of bleach to 1 gallon (16 cups) of water. Read labels of cleaning products and follow recommendations provided on product labels. Labels contain instructions for safe and effective use of the cleaning product including precautions you should take when applying the product, such as wearing gloves or eye protection and making sure you have good ventilation during use of the product. Remove gloves and wash hands immediately after cleaning.  Monitor yourself for signs and symptoms of illness Caregivers and household members are considered close contacts, should monitor their health, and will be asked to limit movement outside of the home to the extent possible. Follow the monitoring steps for close contacts listed on the symptom monitoring form.   ? If you have additional questions, contact your local health department or call the epidemiologist on call at [phone] (available 24/7). ? This guidance is subject to change. For the most up-to-date guidance from [facility], please refer to their website: [email or web address]

## 2020-09-06 NOTE — ED Notes (Signed)
X Ray at bedside at this time.  

## 2020-09-07 LAB — SARS CORONAVIRUS 2 (TAT 6-24 HRS): SARS Coronavirus 2: POSITIVE — AB

## 2020-11-19 LAB — HM PAP SMEAR: HM Pap smear: NORMAL

## 2021-03-14 LAB — HM DIABETES EYE EXAM

## 2021-03-29 LAB — HM COLONOSCOPY

## 2021-05-23 LAB — HM MAMMOGRAPHY

## 2021-08-19 LAB — BASIC METABOLIC PANEL
BUN: 10 (ref 4–21)
Chloride: 102 (ref 99–108)
Creatinine: 0.7 (ref <=1.1)
Glucose: 89
Potassium: 5 mEq/L (ref 3.5–5.1)
Sodium: 142 (ref 137–147)

## 2021-08-19 LAB — HEPATIC FUNCTION PANEL
ALT: 22 U/L (ref 7–35)
AST: 22 (ref 13–35)
Alkaline Phosphatase: 46 (ref 25–125)
Bilirubin, Total: 0.3

## 2021-08-19 LAB — COMPREHENSIVE METABOLIC PANEL
Albumin: 4.3 (ref 3.5–5.0)
Calcium: 9.6 (ref 8.7–10.7)
Globulin: 2.2
eGFR: 104

## 2021-08-19 LAB — LIPID PANEL
Cholesterol: 206 — AB (ref 0–200)
HDL: 62 (ref 35–70)
LDL Cholesterol: 20
Triglycerides: 114 (ref 40–160)

## 2022-01-24 ENCOUNTER — Encounter: Payer: Self-pay | Admitting: Nurse Practitioner

## 2022-01-24 ENCOUNTER — Ambulatory Visit: Payer: Medicaid Other | Admitting: Nurse Practitioner

## 2022-01-24 ENCOUNTER — Other Ambulatory Visit (HOSPITAL_COMMUNITY)
Admission: RE | Admit: 2022-01-24 | Discharge: 2022-01-24 | Disposition: A | Discharge status: Home / Self Care | Payer: Medicaid Other | Source: Ambulatory Visit | Attending: Nurse Practitioner | Admitting: Nurse Practitioner

## 2022-01-24 VITALS — BP 159/91 | HR 74 | Ht 66.0 in | Wt 301.0 lb

## 2022-01-24 DIAGNOSIS — F321 Major depressive disorder, single episode, moderate: Secondary | ICD-10-CM | POA: Diagnosis not present

## 2022-01-24 DIAGNOSIS — E119 Type 2 diabetes mellitus without complications: Secondary | ICD-10-CM | POA: Insufficient documentation

## 2022-01-24 DIAGNOSIS — I10 Essential (primary) hypertension: Secondary | ICD-10-CM | POA: Insufficient documentation

## 2022-01-24 DIAGNOSIS — E118 Type 2 diabetes mellitus with unspecified complications: Secondary | ICD-10-CM

## 2022-01-24 DIAGNOSIS — N898 Other specified noninflammatory disorders of vagina: Secondary | ICD-10-CM

## 2022-01-24 MED ORDER — LOSARTAN POTASSIUM 50 MG PO TABS: 50.0000 mg | ORAL_TABLET | Freq: Every day | ORAL | 0 refills | Status: DC

## 2022-01-24 NOTE — Assessment & Plan Note (Signed)
Currently on Trulicity 1.5 mg injection once weekly Avoid sugar sweets soda Not on a statin need for a statin discussed with patient today. We will obtain records from previous PCP

## 2022-01-24 NOTE — Assessment & Plan Note (Addendum)
Wt Readings from Last 3 Encounters:  01/24/22 (!) 301 lb (136.5 kg)  09/06/20 300 lb (136.1 kg)  04/19/20 280 lb (127 kg)  Current BMI 48.58 Has upcoming gastric sleeve surgery Patient counseled on low-carb diet, engage in regular vigorous exercises at least 150 minutes weekly. On Trulicity 1.5 mg once weekly injection for diabetes

## 2022-01-24 NOTE — Assessment & Plan Note (Addendum)
BP Readings from Last 3 Encounters:  01/24/22 (!) 159/91  09/06/20 (!) 193/114  04/19/20 (!) 157/79  BP goal is less than 130/80 due to history of diabetes Condition currently uncontrolled On losartan 25 mg daily Start losartan 50 mg daily DASH diet advised engage in regular vigorous exercises at least 150 minutes weekly Monitor blood pressure daily at home keep a log and bring to next follow-up visit in 4 weeks BMP in 2 weeks

## 2022-01-24 NOTE — Patient Instructions (Addendum)
Please start taking losartan 50mg  daily for your hypertension    It is important that you exercise regularly at least 30 minutes 5 times a week.  Think about what you will eat, plan ahead. Choose " clean, green, fresh or frozen" over canned, processed or packaged foods which are more sugary, salty and fatty. 70 to 75% of food eaten should be vegetables and fruit. Three meals at set times with snacks allowed between meals, but they must be fruit or vegetables. Aim to eat over a 12 hour period , example 7 am to 7 pm, and STOP after  your last meal of the day. Drink water,generally about 64 ounces per day, no other drink is as healthy. Fruit juice is best enjoyed in a healthy way, by EATING the fruit.  Thanks for choosing [facility], we consider it a privelige to serve you.

## 2022-01-24 NOTE — Progress Notes (Signed)
New Patient Office Visit  Subjective    Patient ID: Penny Luna, female    DOB: [birth year]  Age: 46 y.o. MRN: [id number]  CC:  Chief Complaint  Patient presents with   New Patient (Initial Visit)    NP   Vaginal Discharge    For about a week since around 5/26 with itching     HPI Penny Luna with previous medical history of essential hypertension, type 2 diabetes, obesity presents to establish care for her chronic medical conditions. Previous PCP at [facility] , Dr Mayford Knife, last visit was 2 weeks ago .   Hypertension.  Currently on losartan 25 mg daily, reports that her blood pressure was high at her last visit to her previous PCP, denies chest pain, dizziness, headaches, edema   T2DM . currently on trulicity 1.5 once weekly injection for the past 4 months, not on a statin, denies polyphagia, polyuria, polydipsia.  Obesity , has upcoming weight loss surgery in August , will be doing the gastric  sleeve surgery, goes to [facility].   Depression .  Patient complains of depression ,recently lost her father to colon cancer cancer . Seeing a Counsellor at her her previous PCP's office, has lot of family support at home and church.  Patient feels like she does no need to continue seeing a counselor at this time.  Denies SI, HI   She is up-to-date with annual physical examination, Pap, colonoscopy, mammogram.  Patient stated that her last Pap, colonoscopy, mammogram were all normal.  Records requested from her previous PCP today.     Vaginal discharge pt c/o whitish vaginal discharge since a week ago, reports that she  sometimes has vaginal itching, denies dysuria, fever, chills, flank pain.        Outpatient Encounter Medications as of 01/24/2022  Medication Sig   losartan (COZAAR) 50 MG tablet Take 1 tablet (50 mg total) by mouth daily.   meloxicam (MOBIC) 15 MG tablet Take 1 tablet (15 mg total)  by mouth daily as needed for pain.   Multiple Vitamin (MULTI-VITAMIN) tablet Take 1 tablet by mouth daily.   TRULICITY 1.5 MG/0.5ML SOPN Inject into the skin.   [DISCONTINUED] losartan (COZAAR) 25 MG tablet Take 25 mg by mouth daily.   [DISCONTINUED] neomycin-polymyxin-hydrocortisone (CORTISPORIN) 3.5-10000-1 OTIC suspension Place 4 drops into the right ear 3 (three) times daily.   [DISCONTINUED] predniSONE (DELTASONE) 10 MG tablet Take 4 tablets (40 mg total) by mouth daily with breakfast.   [DISCONTINUED] Prenatal Vit-Fe Fumarate-FA (PRENATAL MULTIVITAMIN) TABS tablet Take 1 tablet by mouth daily at 12 noon.   No facility-administered encounter medications on file as of 01/24/2022.    Past Medical History:  Diagnosis Date   Diabetes mellitus without complication (HCC)    Encounter for sterilization 08/28/2013   H/O: cesarean section 08/28/2013   Hypertension    Obese    S/P cesarean section 08/29/2013    Past Surgical History:  Procedure Laterality Date   CESAREAN SECTION     CESAREAN SECTION WITH BILATERAL TUBAL LIGATION Bilateral 08/29/2013   Procedure: CESAREAN SECTION WITH BILATERAL TUBAL LIGATION;  Surgeon: Sherron Monday, MD;  Location: [facility];  Service: Obstetrics;  Laterality: Bilateral;    Family History  Problem Relation Age of Onset   Cancer Mother    Hypertension Mother    Cervical cancer Mother    Colon cancer Father    Diabetes Maternal Aunt    Diabetes Cousin  Social History   Socioeconomic History   Marital status: Married    Spouse name: Not on file   Number of children: 4   Years of education: Not on file   Highest education level: Not on file  Occupational History   Not on file  Tobacco Use   Smoking status: Never   Smokeless tobacco: Never  Vaping Use   Vaping Use: Never used  Substance and Sexual Activity   Alcohol use: Yes    Comment: occ   Drug use: No   Sexual activity: Yes  Other Topics Concern   Not on file  Social History Narrative    Lives with her husband and four children    Social Determinants of Health   Financial Resource Strain: Not on file  Food Insecurity: Not on file  Transportation Needs: Not on file  Physical Activity: Not on file  Stress: Not on file  Social Connections: Not on file  Intimate Partner Violence: Not on file    ROS      Objective    BP (!) 159/91   Pulse 74   Ht 5\' 6"  (1.676 m)   Wt (!) 301 lb (136.5 kg)   LMP 01/10/2022 (Approximate)   SpO2 97%   Breastfeeding No   BMI 48.58 kg/m   Physical Exam      Assessment & Plan:   Problem List Items Addressed This Visit       Cardiovascular and Mediastinum   High blood pressure    BP Readings from Last 3 Encounters:  01/24/22 (!) 159/91  09/06/20 (!) 193/114  04/19/20 (!) 157/79  BP goal is less than 130/80 due to history of diabetes Currently uncontrolled On losartan 25 mg daily Start losartan 50 mg daily DASH diet advised engage in regular vigorous exercises at least 150 minutes weekly Monitor blood pressure daily at home keep a log and bring to next follow-up visit in 4 weeks BMP in 2 weeks      Relevant Medications   losartan (COZAAR) 50 MG tablet   Other Relevant Orders   Basic Metabolic Panel (BMET)     Endocrine   Controlled diabetes mellitus type 2 with complications (HCC)    Currently on Trulicity 1.5 mg injection once weekly Avoid sugar sweets soda Not on a statin need for a statin discussed with patient today. We will obtain records from previous PCP      Relevant Medications   TRULICITY 1.5 MG/0.5ML SOPN   losartan (COZAAR) 50 MG tablet     Other   Depression, major, single episode, moderate (HCC)    PHQ-9 score 10 Recently lost her father to colon cancer Currently seeing a counselor at her previous PCP office Refuses referral to a counselor here States that she has a lot of family support at home and church Denies SI, HI       Vaginal discharge - Primary    Has whitish vaginal  discharge Sometimes has vaginitis Check urine cytology for Trich and Candida       Relevant Orders   Urine cytology ancillary only    Return in about 4 weeks (around 02/21/2022) for HTN.   02/23/2022, FNP

## 2022-01-24 NOTE — Assessment & Plan Note (Signed)
Has whitish vaginal discharge Sometimes has vaginitis Check urine cytology for Trich and Candida

## 2022-01-24 NOTE — Assessment & Plan Note (Signed)
PHQ-9 score 10 Recently lost her father to colon cancer Currently seeing a counselor at her previous PCP office Refuses referral to a counselor here States that she has a lot of family support at home and church Denies SI, HI

## 2022-01-28 ENCOUNTER — Telehealth: Payer: Self-pay | Admitting: Nurse Practitioner

## 2022-01-28 LAB — URINE CYTOLOGY ANCILLARY ONLY
Candida Urine: NEGATIVE
Comment: NEGATIVE
Trichomonas: NEGATIVE

## 2022-01-28 NOTE — Telephone Encounter (Signed)
Spoke with pt

## 2022-01-28 NOTE — Telephone Encounter (Signed)
Pt returning your call

## 2022-01-28 NOTE — Progress Notes (Signed)
Test was normal

## 2022-01-29 ENCOUNTER — Encounter: Payer: Self-pay | Admitting: Nurse Practitioner

## 2022-02-25 LAB — BASIC METABOLIC PANEL
BUN/Creatinine Ratio: 11 (ref 9–23)
BUN: 9 mg/dL (ref 6–24)
CO2: 24 mmol/L (ref 20–29)
Calcium: 9 mg/dL (ref 8.7–10.2)
Chloride: 106 mmol/L (ref 96–106)
Creatinine, Ser: 0.8 mg/dL (ref 0.57–1.00)
Glucose: 93 mg/dL (ref 70–99)
Potassium: 4.9 mmol/L (ref 3.5–5.2)
Sodium: 141 mmol/L (ref 134–144)
eGFR: 91 mL/min/{1.73_m2} (ref >59)

## 2022-02-26 ENCOUNTER — Other Ambulatory Visit: Payer: Self-pay | Admitting: Nurse Practitioner

## 2022-02-26 NOTE — Progress Notes (Signed)
Will discuss results at her upcoming appointment

## 2022-02-27 ENCOUNTER — Ambulatory Visit: Payer: Medicaid Other | Admitting: Nurse Practitioner

## 2022-02-27 ENCOUNTER — Encounter: Payer: Self-pay | Admitting: Nurse Practitioner

## 2022-02-27 VITALS — BP 143/88 | HR 82 | Ht 66.0 in | Wt 296.0 lb

## 2022-02-27 DIAGNOSIS — E118 Type 2 diabetes mellitus with unspecified complications: Secondary | ICD-10-CM | POA: Diagnosis not present

## 2022-02-27 DIAGNOSIS — Z298 Encounter for other specified prophylactic measures: Secondary | ICD-10-CM

## 2022-02-27 DIAGNOSIS — I10 Essential (primary) hypertension: Secondary | ICD-10-CM | POA: Diagnosis not present

## 2022-02-27 MED ORDER — LOSARTAN POTASSIUM 50 MG PO TABS: 50.0000 mg | ORAL_TABLET | Freq: Every day | ORAL | 0 refills | Status: DC

## 2022-02-27 MED ORDER — FLUCONAZOLE 150 MG PO TABS: 150.0000 mg | ORAL_TABLET | Freq: Once | ORAL | 0 refills | Status: AC

## 2022-02-27 MED ORDER — DOXYCYCLINE HYCLATE 100 MG PO TABS: 100.0000 mg | ORAL_TABLET | Freq: Two times a day (BID) | ORAL | 0 refills | Status: DC

## 2022-02-27 NOTE — Assessment & Plan Note (Signed)
Travelling to [place] take doxycycline 100mg  daily for malaria prophylaxis. Start 1-2 days before travel, daily during travel and for 4 weeks after leaving. Please take fluconazole 150 mg one time dose if you develop vagina itching while taking doxycycline.

## 2022-02-27 NOTE — Progress Notes (Signed)
   Penny Luna     MRN: [id number]      DOB: November 14, 1975   HPI Penny Luna past medical history of hypertension, type 2 diabetes, morbid obesity is here for follow up for hypertension.  Currently taking losartan 50 mg daily, states that she has been taking the medication daily as ordered however she did not take her medication today.  She has been checking blood pressure daily at home but she did not bring her blood pressure log.  Denies chest pain, dizziness, edema.  Denies adverse reactions to current medications  Patient stated that she is traveling to [place] later this month and she intends to stay there for 3 weeks, patient is requesting for malaria prophylaxis.  She stated that she had doxycycline 4 years ago.         ROS Denies recent fever or chills. Denies sinus pressure, nasal congestion, ear pain or sore throat. Denies chest congestion, productive cough or wheezing. Denies chest pains, palpitations and leg swelling Denies abdominal pain, nausea, vomiting,diarrhea or constipation.   Denies dysuria, frequency, hesitancy or incontinence. Denies joint pain, swelling and limitation in mobility. Denies headaches, seizures, numbness, or tingling. Denies depression, anxiety or insomnia. Denies skin break down or rash.   PE  BP 140/81 (BP Location: Right Arm, Patient Position: Sitting, Cuff Size: Large)   Pulse 82   Ht 5\' 6"  (1.676 m)   Wt 296 lb (134.3 kg)   LMP 02/25/2022 (Exact Date)   SpO2 98%   BMI 47.78 kg/m   Patient alert and oriented and in no cardiopulmonary distress.  HEENT: No facial asymmetry, EOMI,     Neck supple .  Chest: Clear to auscultation bilaterally.  CVS: S1, S2 no murmurs, no S3.Regular rate.  ABD: Soft non tender.   Ext: No edema  MS: Adequate ROM spine, shoulders, hips and knees.  Skin: Intact, no ulcerations or rash noted.  Psych: Good eye contact, normal affect. Memory intact not anxious or depressed appearing.  CNS: CN  2-12 intact, power,  normal throughout.no focal deficits noted.   Assessment & Plan  High blood pressure BP Readings from Last 3 Encounters:  02/27/22 (!) 143/88  01/24/22 (!) 159/91  09/06/20 (!) 193/114  Currently taking losartan 50 mg daily She has not taken her blood pressure medication today but she has been taking it daily Patient encouraged to continue to take her blood pressure medication daily blood pressure goal is less than 130/80 Medication refilled Follow-up in 4 months  Controlled diabetes mellitus type 2 with complications (HCC) Continue Trulicity 1.5 mg injection once weekly We will check A1c in 4 months Again we requested for her records from previous PCP today Diabetic foot exam completed, she has had eye exam done in the past 1 year we will obtain records from my eye doctor in [place] . Avoid sugar sweets soda  Need for malaria prophylaxis Travelling to [place] take doxycycline 100mg  daily for malaria prophylaxis. Start 1-2 days before travel, daily during travel and for 4 weeks after leaving. Please take fluconazole 150 mg one time dose if you develop vagina itching while taking doxycycline.

## 2022-02-27 NOTE — Assessment & Plan Note (Addendum)
BP Readings from Last 3 Encounters:  02/27/22 (!) 143/88  01/24/22 (!) 159/91  09/06/20 (!) 193/114  Currently taking losartan 50 mg daily She has not taken her blood pressure medication today but she has been taking it daily Patient encouraged to continue to take her blood pressure medication daily blood pressure goal is less than 130/80 Medication refilled Follow-up in 4 months

## 2022-02-27 NOTE — Assessment & Plan Note (Signed)
Continue Trulicity 1.5 mg injection once weekly We will check A1c in 4 months Again we requested for her records from previous PCP today Diabetic foot exam completed, she has had eye exam done in the past 1 year we will obtain records from my eye doctor in [place] . Avoid sugar sweets soda

## 2022-02-27 NOTE — Patient Instructions (Addendum)
  Please take doxycycline 100mg  daily for malaria prophylaxis. Start 1-2 days before travel, daily during travel and for 4 weeks after leaving. Please take fluconazole 150 mg one time dose if you develop vagina itching while taking doxycycline.      It is important that you exercise regularly at least 30 minutes 5 times a week.  Think about what you will eat, plan ahead. Choose " clean, green, fresh or frozen" over canned, processed or packaged foods which are more sugary, salty and fatty. 70 to 75% of food eaten should be vegetables and fruit. Three meals at set times with snacks allowed between meals, but they must be fruit or vegetables. Aim to eat over a 12 hour period , example 7 am to 7 pm, and STOP after  your last meal of the day. Drink water,generally about 64 ounces per day, no other drink is as healthy. Fruit juice is best enjoyed in a healthy way, by EATING the fruit.  Thanks for choosing [facility], we consider it a privelige to serve you.

## 2022-02-28 ENCOUNTER — Encounter: Payer: Self-pay | Admitting: Nurse Practitioner

## 2022-03-03 ENCOUNTER — Encounter: Payer: Self-pay | Admitting: Nurse Practitioner

## 2022-03-18 ENCOUNTER — Ambulatory Visit: Payer: Medicaid Other | Admitting: Nurse Practitioner

## 2022-03-26 IMAGING — DX DG CHEST 1V PORT
1 series · 1 of 1 positions shown · non-contrast
Comparison: 08/28/2018

CLINICAL DATA: Reason for exam: Cough, SOB, and body aches x 3
days. No known covid exposure. Pt has been vaccinated [REDACTED]DJ
covid vaccine. No hx of covid per pt. No chest pain.

EXAM:
PORTABLE CHEST 1 VIEW

[chest ap]
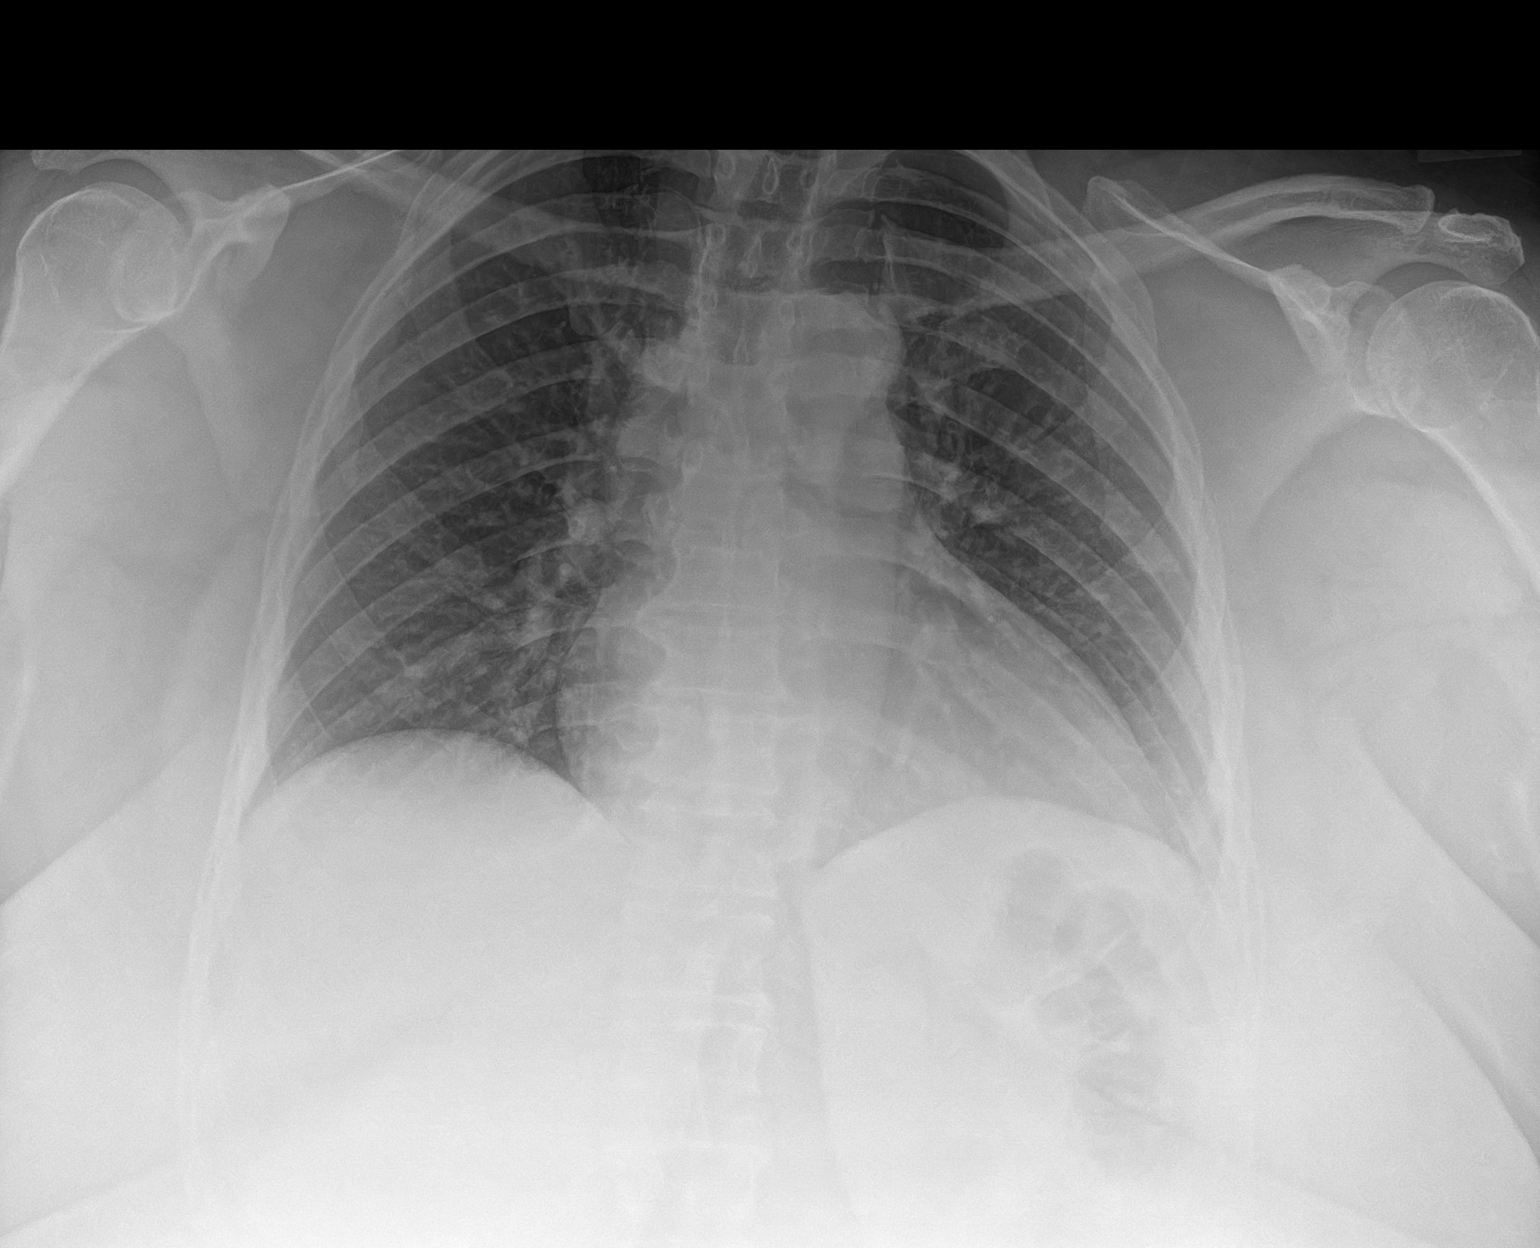

[1 of 1 positions shown; findings below may reference images not displayed]

FINDINGS: Cardiac silhouette is normal in size. Normal mediastinal and hilar
contours.

Lungs are clear.  No pleural effusion or pneumothorax.

Skeletal structures are grossly intact.
IMPRESSION: No active disease.

## 2022-04-08 ENCOUNTER — Other Ambulatory Visit: Payer: Self-pay

## 2022-04-08 ENCOUNTER — Telehealth: Payer: Self-pay | Admitting: Nurse Practitioner

## 2022-04-08 MED ORDER — TRULICITY 1.5 MG/0.5ML ~~LOC~~ SOAJ: 1.5000 mg | SUBCUTANEOUS | 3 refills | Status: DC

## 2022-04-08 NOTE — Telephone Encounter (Signed)
Rx sent 

## 2022-04-08 NOTE — Telephone Encounter (Signed)
Patient needs refill on   TRULICITY 1.5 MG/0.5ML SOPN  

## 2022-07-01 ENCOUNTER — Encounter: Payer: Self-pay | Admitting: Family Medicine

## 2022-07-01 ENCOUNTER — Ambulatory Visit: Payer: Medicaid Other | Admitting: Family Medicine

## 2022-07-01 ENCOUNTER — Telehealth: Payer: Self-pay | Admitting: Family Medicine

## 2022-07-01 ENCOUNTER — Ambulatory Visit: Payer: Medicaid Other | Admitting: Nurse Practitioner

## 2022-07-01 VITALS — BP 124/76 | HR 85 | Ht 66.0 in | Wt 262.1 lb

## 2022-07-01 DIAGNOSIS — I10 Essential (primary) hypertension: Secondary | ICD-10-CM

## 2022-07-01 DIAGNOSIS — E7849 Other hyperlipidemia: Secondary | ICD-10-CM

## 2022-07-01 DIAGNOSIS — Z2821 Immunization not carried out because of patient refusal: Secondary | ICD-10-CM | POA: Diagnosis not present

## 2022-07-01 DIAGNOSIS — E118 Type 2 diabetes mellitus with unspecified complications: Secondary | ICD-10-CM

## 2022-07-01 DIAGNOSIS — L700 Acne vulgaris: Secondary | ICD-10-CM | POA: Diagnosis not present

## 2022-07-01 DIAGNOSIS — E038 Other specified hypothyroidism: Secondary | ICD-10-CM

## 2022-07-01 DIAGNOSIS — E559 Vitamin D deficiency, unspecified: Secondary | ICD-10-CM

## 2022-07-01 DIAGNOSIS — R7301 Impaired fasting glucose: Secondary | ICD-10-CM | POA: Diagnosis not present

## 2022-07-01 MED ORDER — TRETINOIN 0.025 % EX GEL: CUTANEOUS | 0 refills | Status: AC

## 2022-07-01 MED ORDER — TRETINOIN 0.025 % EX GEL: CUTANEOUS | 0 refills | Status: DC

## 2022-07-01 MED ORDER — BENZOYL PEROXIDE 2.5 % EX CREA: TOPICAL_CREAM | CUTANEOUS | 0 refills | Status: DC

## 2022-07-01 NOTE — Assessment & Plan Note (Signed)
We will treat with topical Retin-A and benzyl peroxide Inform patient not to apply Retin-A and benzyl peroxide simultaneously Inform patient to apply Retin-A at bedtime 20 to 30 minutes after cleansing the face Inform patient to apply thin layer of the topical retinoid to the entire face area rather than as a spot treatment to individual lesions Inform patient a pea-sized amount of medication is usually sufficient to cover the entire face  Informed the patient that skin should be dry at time of application

## 2022-07-01 NOTE — Progress Notes (Signed)
Established Patient Office Visit  Subjective:  Patient ID: Penny Luna, female    DOB: 1976-03-11  Age: 46 y.o. MRN: [id number]  CC:  Chief Complaint  Patient presents with   Follow-up    4 month f/u, pt reports taking medication and checking blood sugars.     HPI Penny Luna is a 46 y.o. female with past medical history of hypertension, type 2 diabetes, and obesity presents for f/u of  chronic medical conditions.  Type 2 diabetes: She takes Trulicity 1.5 mg weekly subcu injection.  She denies polyuria, polyphagia, polydipsia.  She reports compliance with treatment regimen.  Hypertension: She takes losartan 50 mg daily.  She denies headaches, dizziness, blurred vision, chest pain, palpitation, shortness of breath.  She reports compliance with low-sodium diet and treatment regimen.  Acne vulgaris: She reports having cystic acne since her gastric sleeve on 04/22/2022.  She reports that she has been using CeraVe face wash with minimal relief of her symptoms.  She would like a referral to dermatology.  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  She reports doing well since the surgery and would like to make adjustments to her blood pressure medication.  She has lost 34 pounds since February 27, 2022.  She denies  postop complication after surgery.        Past Medical History:  Diagnosis Date   Diabetes mellitus without complication ([place])    Encounter for sterilization 08/28/2013   H/O: cesarean section 08/28/2013   Hypertension    Obese    S/P cesarean section 08/29/2013    Past Surgical History:  Procedure Laterality Date   CESAREAN SECTION     CESAREAN SECTION WITH BILATERAL TUBAL LIGATION Bilateral 08/29/2013   Procedure: CESAREAN SECTION WITH BILATERAL TUBAL LIGATION;  Surgeon: Thornell Sartorius, MD;  Location: [facility];  Service: Obstetrics;  Laterality: Bilateral;    Family History  Problem Relation Age of Onset   Cancer Mother    Hypertension  Mother    Cervical cancer Mother    Colon cancer Father    Diabetes Maternal Aunt    Diabetes Cousin     Social History   Socioeconomic History   Marital status: Married    Spouse name: Not on file   Number of children: 4   Years of education: Not on file   Highest education level: Not on file  Occupational History   Not on file  Tobacco Use   Smoking status: Never   Smokeless tobacco: Never  Vaping Use   Vaping Use: Never used  Substance and Sexual Activity   Alcohol use: Yes    Comment: occ   Drug use: No   Sexual activity: Yes  Other Topics Concern   Not on file  Social History Narrative   Lives with her husband and four children    Social Determinants of Health   Financial Resource Strain: Not on file  Food Insecurity: Not on file  Transportation Needs: Not on file  Physical Activity: Not on file  Stress: Not on file  Social Connections: Not on file  Intimate Partner Violence: Not on file    Outpatient Medications Prior to Visit  Medication Sig Dispense Refill   Calcium Carbonate Antacid (CALCIUM CARBONATE PO) Take 3 capsules by mouth daily.     Cyanocobalamin 1000 MCG SUBL Place under the tongue.     losartan (COZAAR) 50 MG tablet Take 1 tablet (50 mg total) by mouth daily. 90 tablet 0   Multiple  Vitamin (MULTI-VITAMIN) tablet Take 1 tablet by mouth daily.     omeprazole (PRILOSEC) 20 MG capsule Take 1 capsule by mouth daily.     ondansetron (ZOFRAN-ODT) 8 MG disintegrating tablet Take by mouth.     senna-docusate (SENOKOT-S) 8.6-50 MG tablet Take 1 tablet by mouth 2 (two) times daily.     TRULICITY 1.5 IR/6.7EL SOPN Inject 1.5 mg into the skin once a week. 0.5 mL 3   ursodiol (ACTIGALL) 250 MG tablet Take by mouth.     meloxicam (MOBIC) 15 MG tablet Take 1 tablet (15 mg total) by mouth daily as needed for pain. 10 tablet 0   doxycycline (VIBRA-TABS) 100 MG tablet Take 1 tablet (100 mg total) by mouth 2 (two) times daily. 60 tablet 0   No  facility-administered medications prior to visit.    Allergies  Allergen Reactions   Lactose Intolerance (Gi) Nausea Only    ROS Review of Systems  Constitutional:  Negative for fatigue and fever.  Eyes:  Negative for visual disturbance.  Respiratory:  Negative for chest tightness and shortness of breath.   Cardiovascular:  Negative for chest pain and palpitations.  Gastrointestinal:  Positive for nausea.  Endocrine: Negative for polydipsia, polyphagia and polyuria.  Skin:        Cystic acne  Neurological:  Negative for dizziness and headaches.      Objective:    Physical Exam HENT:     Head: Normocephalic.  Cardiovascular:     Rate and Rhythm: Normal rate and regular rhythm.     Pulses: Normal pulses.     Heart sounds: Normal heart sounds.  Pulmonary:     Effort: Pulmonary effort is normal.     Breath sounds: Normal breath sounds.  Abdominal:     Palpations: Abdomen is soft.  Skin:    Findings: Lesion present.     Comments: Presence of papules and pustules with comedones noted on the face  Neurological:     Mental Status: She is alert.     BP 124/76   Pulse 85   Ht _0  (1.676 m)   Wt 262 lb 1.3 oz (118.9 kg)   SpO2 97%   BMI 42.30 kg/m  Wt Readings from Last 3 Encounters:  07/01/22 262 lb 1.3 oz (118.9 kg)  02/27/22 296 lb (134.3 kg)  01/24/22 (!) 301 lb (136.5 kg)    No results found for: "TSH" Lab Results  Component Value Date   WBC 7.1 08/28/2018   HGB 13.1 08/28/2018   HCT 39.3 08/28/2018   MCV 91.6 08/28/2018   PLT 334 08/28/2018   Lab Results  Component Value Date   NA 141 02/24/2022   K 4.9 02/24/2022   CO2 24 02/24/2022   GLUCOSE 93 02/24/2022   BUN 9 02/24/2022   CREATININE 0.80 02/24/2022   BILITOT 0.2 (L) 08/30/2013   ALKPHOS 46 08/19/2021   AST 22 08/19/2021   ALT 22 08/19/2021   PROT 5.1 (L) 08/30/2013   ALBUMIN 4.3 08/19/2021   CALCIUM 9.0 02/24/2022   ANIONGAP 8 08/28/2018   EGFR 91 02/24/2022   Lab Results   Component Value Date   CHOL 206 (A) 08/19/2021   Lab Results  Component Value Date   HDL 62 08/19/2021   Lab Results  Component Value Date   LDLCALC 20 08/19/2021   Lab Results  Component Value Date   TRIG 114 08/19/2021   No results found for: "CHOLHDL" No results found for: "HGBA1C"  Assessment & Plan:   Problem List Items Addressed This Visit       Cardiovascular and Mediastinum   High blood pressure    Controlled She takes losartan 50 mg daily She denies headaches, dizziness, blurred vision, chest pain, palpitation, and shortness of breath No changes to treatment regimen Encouraged to check blood pressure daily at home Recommended not to take her blood pressure medication if her blood pressure is less than 90/60 BP Readings from Last 3 Encounters:  07/01/22 124/76  02/27/22 (!) 143/88  01/24/22 (!) 159/91         Relevant Orders   CBC with Differential/Platelet   CMP14+EGFR     Endocrine   Controlled diabetes mellitus type 2 with complications ([place])    She takes Trulicity 1.5 mg weekly subcu injection She reports some nausea with the medication but take Zofran 8 mg tablet as needed for nausea She denies polyuria, polyphagia, polydipsia We will check hemoglobin A1c today as there are no recent hemoglobin A1c       Relevant Orders   Microalbumin / creatinine urine ratio     Musculoskeletal and Integument   Acne vulgaris    We will treat with topical Retin-A and benzyl peroxide Inform patient not to apply Retin-A and benzyl peroxide simultaneously Inform patient to apply Retin-A at bedtime 20 to 30 minutes after cleansing the face Inform patient to apply thin layer of the topical retinoid to the entire face area rather than as a spot treatment to individual lesions Inform patient a pea-sized amount of medication is usually sufficient to cover the entire face  Informed the patient that skin should be dry at time of application       Relevant  Medications   Benzoyl Peroxide 2.5 % CREA   tretinoin (RETIN-A) 0.025 % gel     Other   Morbid obesity ([place])    She had a laparoscopic sleeve gastrectomy on 04/22/2022 She has lost 34 pounds since 02/27/2022 She denies postop complication and reports doing well Wt Readings from Last 3 Encounters:  07/01/22 262 lb 1.3 oz (118.9 kg)  02/27/22 296 lb (134.3 kg)  01/24/22 (!) 301 lb (136.5 kg)         Relevant Medications   Calcium Carbonate Antacid (CALCIUM CARBONATE PO)   Other Visit Diagnoses     Influenza vaccine refused    -  Primary   Cystic acne vulgaris       Relevant Medications   Benzoyl Peroxide 2.5 % CREA   tretinoin (RETIN-A) 0.025 % gel   Vitamin D deficiency       Relevant Orders   VITAMIN D 25 Hydroxy (Vit-D Deficiency, Fractures)   IFG (impaired fasting glucose)       Relevant Orders   Hemoglobin A1c   Other hyperlipidemia       Relevant Orders   Lipid Profile   Other specified hypothyroidism       Relevant Orders   TSH + free T4       Meds ordered this encounter  Medications   DISCONTD: Benzoyl Peroxide 2.5 % CREA    Sig: Apply benzoyl peroxide in the morning to  the face    Dispense:  21 g    Refill:  0   DISCONTD: tretinoin (RETIN-A) 0.025 % gel    Sig: Apply to acne lesions once daily before bedtime or in the evening; apply 20-30 minutes after cleansing the face;Patients should apply a thin layer of the topical retinoid to  the entire affected area rather than as a spot treatment to individual lesions. A pea-sized amount of medication is usually sufficient to cover the entire face. Skin should be dry at the time of application    Dispense:  45 g    Refill:  0   Benzoyl Peroxide 2.5 % CREA    Sig: Apply benzoyl peroxide in the morning to  the face    Dispense:  21 g    Refill:  0   tretinoin (RETIN-A) 0.025 % gel    Sig: Apply to acne lesions once daily before bedtime or in the evening; apply 20-30 minutes after cleansing the face;Patients should  apply a thin layer of the topical retinoid to the entire affected area rather than as a spot treatment to individual lesions. A pea-sized amount of medication is usually sufficient to cover the entire face. Skin should be dry at the time of application    Dispense:  45 g    Refill:  0    Follow-up: Return in about 1 month (around 07/31/2022).    Alvira Monday, FNP

## 2022-07-01 NOTE — Progress Notes (Signed)
documentation

## 2022-07-01 NOTE — Patient Instructions (Addendum)
I appreciate the opportunity to provide care to you today!    Follow up:  1 months  Labs: please stop by the lab today to get your blood drawn (CBC, CMP, TSH, Lipid profile, HgA1c, Vit D)  - Please do not apply Retin-A and benzoyl peroxide simultaneously to the skin  -benzoyl peroxide should be applied in the morning and  Retin-A in the evening Please apply Retin-A cream every other day at bedtime for 2 to 3 weeks, then daily application at bedtime. Acne may worsen the first few weeks of use with topical Retin-A.  In about 4 to 6 weeks acne improving flares up  Please check your blood pressure daily at home, do not take your blood pressure medication if your blood pressure is less than 90/60  I will follow-up with you in 1 month to assess treatment regimen     Please continue to a heart-healthy diet and increase your physical activities. Try to exercise for 93mins at least three times a week.      It was a pleasure to see you and I look forward to continuing to work together on your health and well-being. Please do not hesitate to call the office if you need care or have questions about your care.   Have a wonderful day and week. With Gratitude, Alvira Monday MSN, FNP-BC

## 2022-07-01 NOTE — Assessment & Plan Note (Signed)
Controlled She takes losartan 50 mg daily She denies headaches, dizziness, blurred vision, chest pain, palpitation, and shortness of breath No changes to treatment regimen Encouraged to check blood pressure daily at home Recommended not to take her blood pressure medication if her blood pressure is less than 90/60 BP Readings from Last 3 Encounters:  07/01/22 124/76  02/27/22 (!) 143/88  01/24/22 (!) 159/91

## 2022-07-01 NOTE — Assessment & Plan Note (Signed)
She takes Trulicity 1.5 mg weekly subcu injection She reports some nausea with the medication but take Zofran 8 mg tablet as needed for nausea She denies polyuria, polyphagia, polydipsia We will check hemoglobin A1c today as there are no recent hemoglobin A1c

## 2022-07-01 NOTE — Assessment & Plan Note (Signed)
She had a laparoscopic sleeve gastrectomy on 04/22/2022 She has lost 34 pounds since 02/27/2022 She denies postop complication and reports doing well Wt Readings from Last 3 Encounters:  07/01/22 262 lb 1.3 oz (118.9 kg)  02/27/22 296 lb (134.3 kg)  01/24/22 (!) 301 lb (136.5 kg)

## 2022-07-01 NOTE — Telephone Encounter (Signed)
The prescriptions have been sent to [facility]

## 2022-07-01 NOTE — Telephone Encounter (Signed)
Chuck Hint, [phone]    Called stating she has some questions in regards to the medication called in today. Can you please call her?

## 2022-07-02 LAB — CMP14+EGFR
ALT: 14 IU/L (ref 0–32)
AST: 19 IU/L (ref 0–40)
Albumin/Globulin Ratio: 2 (ref 1.2–2.2)
Albumin: 3.8 g/dL — ABNORMAL LOW (ref 3.9–4.9)
Alkaline Phosphatase: 39 IU/L — ABNORMAL LOW (ref 44–121)
BUN/Creatinine Ratio: 18 (ref 9–23)
BUN: 12 mg/dL (ref 6–24)
Bilirubin Total: 0.4 mg/dL (ref 0.0–1.2)
CO2: 27 mmol/L (ref 20–29)
Calcium: 9.3 mg/dL (ref 8.7–10.2)
Chloride: 104 mmol/L (ref 96–106)
Creatinine, Ser: 0.67 mg/dL (ref 0.57–1.00)
Globulin, Total: 1.9 g/dL (ref 1.5–4.5)
Glucose: 88 mg/dL (ref 70–99)
Potassium: 4.4 mmol/L (ref 3.5–5.2)
Sodium: 141 mmol/L (ref 134–144)
Total Protein: 5.7 g/dL — ABNORMAL LOW (ref 6.0–8.5)
eGFR: 108 mL/min/{1.73_m2} (ref >59)

## 2022-07-02 LAB — CBC WITH DIFFERENTIAL/PLATELET
Basophils Absolute: 0 10*3/uL (ref 0.0–0.2)
Basos: 1 %
EOS (ABSOLUTE): 0.1 10*3/uL (ref 0.0–0.4)
Eos: 3 %
Hematocrit: 36.9 % (ref 34.0–46.6)
Hemoglobin: 12 g/dL (ref 11.1–15.9)
Immature Grans (Abs): 0 10*3/uL (ref 0.0–0.1)
Immature Granulocytes: 0 %
Lymphocytes Absolute: 2.2 10*3/uL (ref 0.7–3.1)
Lymphs: 52 %
MCH: 31.1 pg (ref 26.6–33.0)
MCHC: 32.5 g/dL (ref 31.5–35.7)
MCV: 96 fL (ref 79–97)
Monocytes Absolute: 0.5 10*3/uL (ref 0.1–0.9)
Monocytes: 12 %
Neutrophils Absolute: 1.4 10*3/uL (ref 1.4–7.0)
Neutrophils: 32 %
Platelets: 286 10*3/uL (ref 150–450)
RBC: 3.86 x10E6/uL (ref 3.77–5.28)
RDW: 15 % (ref 11.7–15.4)
WBC: 4.3 10*3/uL (ref 3.4–10.8)

## 2022-07-02 LAB — LIPID PANEL
Chol/HDL Ratio: 3.7 ratio (ref 0.0–4.4)
Cholesterol, Total: 184 mg/dL (ref 100–199)
HDL: 50 mg/dL (ref >39)
LDL Chol Calc (NIH): 118 mg/dL — ABNORMAL HIGH (ref 0–99)
Triglycerides: 87 mg/dL (ref 0–149)
VLDL Cholesterol Cal: 16 mg/dL (ref 5–40)

## 2022-07-02 LAB — HEMOGLOBIN A1C
Est. average glucose Bld gHb Est-mCnc: 105 mg/dL
Hgb A1c MFr Bld: 5.3 % (ref 4.8–5.6)

## 2022-07-02 LAB — VITAMIN D 25 HYDROXY (VIT D DEFICIENCY, FRACTURES): Vit D, 25-Hydroxy: 40.3 ng/mL (ref 30.0–100.0)

## 2022-07-02 LAB — TSH+FREE T4
Free T4: 1.3 ng/dL (ref 0.82–1.77)
TSH: 1.04 u[IU]/mL (ref 0.450–4.500)

## 2022-07-03 ENCOUNTER — Telehealth: Payer: Self-pay | Admitting: Family Medicine

## 2022-07-03 LAB — MICROALBUMIN / CREATININE URINE RATIO
Creatinine, Urine: 247.7 mg/dL
Microalb/Creat Ratio: 7 mg/g creat (ref 0–29)
Microalbumin, Urine: 18.2 ug/mL

## 2022-07-03 NOTE — Telephone Encounter (Signed)
Penny Luna, [phone]    Called stating the Benzoyl Peroxide 2.5 % CREA  only comes in gel. Can you please change this to gel?

## 2022-07-04 ENCOUNTER — Other Ambulatory Visit: Payer: Self-pay | Admitting: Family Medicine

## 2022-07-04 DIAGNOSIS — L700 Acne vulgaris: Secondary | ICD-10-CM

## 2022-07-04 MED ORDER — BENZOYL PEROXIDE 2.5 % EX GEL: CUTANEOUS | 0 refills | Status: DC

## 2022-07-04 NOTE — Telephone Encounter (Signed)
Prescription sent

## 2022-07-04 NOTE — Telephone Encounter (Signed)
Left vm letting pt know  

## 2022-07-29 ENCOUNTER — Telehealth: Payer: Self-pay | Admitting: Family Medicine

## 2022-07-29 NOTE — Telephone Encounter (Signed)
Patient needs med re sent to [facility] did not receive. Needs med sent to [facility]   Benzoyl Peroxide 2.5 % gel

## 2022-07-30 ENCOUNTER — Other Ambulatory Visit: Payer: Self-pay

## 2022-07-30 DIAGNOSIS — L700 Acne vulgaris: Secondary | ICD-10-CM

## 2022-07-30 MED ORDER — BENZOYL PEROXIDE 2.5 % EX GEL: CUTANEOUS | 0 refills | Status: AC

## 2022-07-30 NOTE — Telephone Encounter (Signed)
Left message, rx sent.

## 2022-08-01 ENCOUNTER — Encounter: Payer: Self-pay | Admitting: Family Medicine

## 2022-08-01 ENCOUNTER — Ambulatory Visit: Payer: Medicaid Other | Admitting: Family Medicine

## 2022-08-01 VITALS — BP 134/88 | HR 78 | Ht 66.0 in | Wt 254.0 lb

## 2022-08-01 DIAGNOSIS — E118 Type 2 diabetes mellitus with unspecified complications: Secondary | ICD-10-CM | POA: Diagnosis not present

## 2022-08-01 DIAGNOSIS — I10 Essential (primary) hypertension: Secondary | ICD-10-CM | POA: Diagnosis not present

## 2022-08-01 DIAGNOSIS — L700 Acne vulgaris: Secondary | ICD-10-CM

## 2022-08-01 NOTE — Progress Notes (Unsigned)
Established Patient Office Visit  Subjective:  Patient ID: Penny Luna, female    DOB: [birth year]  Age: 46 y.o. MRN: [id number]  CC:  Chief Complaint  Patient presents with   Follow-up    1 month f/u, has lost 8 lbs since the last time she was here.     HPI Penny Luna is a 46 y.o. female with past medical history of hypertension, acne vulgaris, and type 2 diabetes presents for f/u of  chronic medical conditions.  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  She reports doing well since the surgery. she notes checking her blood pressure daily, and taking her medication only when her blood pressure is elevated.  She has lost 8 pounds since last seen and reports doing well.      Past Medical History:  Diagnosis Date   Diabetes mellitus without complication ([place])    Encounter for sterilization 08/28/2013   H/O: cesarean section 08/28/2013   Hypertension    Obese    S/P cesarean section 08/29/2013    Past Surgical History:  Procedure Laterality Date   CESAREAN SECTION     CESAREAN SECTION WITH BILATERAL TUBAL LIGATION Bilateral 08/29/2013   Procedure: CESAREAN SECTION WITH BILATERAL TUBAL LIGATION;  Surgeon: Thornell Sartorius, MD;  Location: [facility];  Service: Obstetrics;  Laterality: Bilateral;    Family History  Problem Relation Age of Onset   Cancer Mother    Hypertension Mother    Cervical cancer Mother    Colon cancer Father    Diabetes Maternal Aunt    Diabetes Cousin     Social History   Socioeconomic History   Marital status: Married    Spouse name: Not on file   Number of children: 4   Years of education: Not on file   Highest education level: Not on file  Occupational History   Not on file  Tobacco Use   Smoking status: Never   Smokeless tobacco: Never  Vaping Use   Vaping Use: Never used  Substance and Sexual Activity   Alcohol use: Yes    Comment: occ   Drug use: No   Sexual activity: Yes  Other Topics Concern   Not  on file  Social History Narrative   Lives with her husband and four children    Social Determinants of Health   Financial Resource Strain: Not on file  Food Insecurity: Not on file  Transportation Needs: Not on file  Physical Activity: Not on file  Stress: Not on file  Social Connections: Not on file  Intimate Partner Violence: Not on file    Outpatient Medications Prior to Visit  Medication Sig Dispense Refill   Benzoyl Peroxide 2.5 % gel Apply sparingly once daily; gradually increase to 2 to 3 times/day if needed. If excessive dryness or peeling occurs, reduce dose frequency or concentration. If excessive stinging or burning occurs, remove with mild soap and water; resume use the next day. 42.5 g 0   Calcium Carbonate Antacid (CALCIUM CARBONATE PO) Take 3 capsules by mouth daily.     Cyanocobalamin 1000 MCG SUBL Place under the tongue.     losartan (COZAAR) 50 MG tablet Take 1 tablet (50 mg total) by mouth daily. 90 tablet 0   Multiple Vitamin (MULTI-VITAMIN) tablet Take 1 tablet by mouth daily.     omeprazole (PRILOSEC) 20 MG capsule Take 1 capsule by mouth daily.     ondansetron (ZOFRAN-ODT) 8 MG disintegrating tablet Take by mouth.  senna-docusate (SENOKOT-S) 8.6-50 MG tablet Take 1 tablet by mouth 2 (two) times daily.     tretinoin (RETIN-A) 0.025 % gel Apply to acne lesions once daily before bedtime or in the evening; apply 20-30 minutes after cleansing the face;Patients should apply a thin layer of the topical retinoid to the entire affected area rather than as a spot treatment to individual lesions. A pea-sized amount of medication is usually sufficient to cover the entire face. Skin should be dry at the time of application 45 g 0   TRULICITY 1.5 KD/3.2IZ SOPN Inject 1.5 mg into the skin once a week. 0.5 mL 3   ursodiol (ACTIGALL) 250 MG tablet Take by mouth.     No facility-administered medications prior to visit.    Allergies  Allergen Reactions   Lactose Intolerance  (Gi) Nausea Only    ROS Review of Systems  Constitutional:  Negative for chills and fever.  Eyes:  Negative for visual disturbance.  Respiratory:  Negative for chest tightness and shortness of breath.   Neurological:  Negative for dizziness and headaches.      Objective:    Physical Exam HENT:     Head: Normocephalic.     Mouth/Throat:     Mouth: Mucous membranes are moist.  Cardiovascular:     Rate and Rhythm: Normal rate.     Heart sounds: Normal heart sounds.  Pulmonary:     Effort: Pulmonary effort is normal.     Breath sounds: Normal breath sounds.  Neurological:     Mental Status: She is alert.     BP 134/88   Pulse 78   Ht _0  (1.676 m)   Wt 254 lb 0.6 oz (115.2 kg)   SpO2 100%   BMI 41.00 kg/m  Wt Readings from Last 3 Encounters:  08/01/22 254 lb 0.6 oz (115.2 kg)  07/01/22 262 lb 1.3 oz (118.9 kg)  02/27/22 296 lb (134.3 kg)    Lab Results  Component Value Date   TSH 1.040 07/01/2022   Lab Results  Component Value Date   WBC 4.3 07/01/2022   HGB 12.0 07/01/2022   HCT 36.9 07/01/2022   MCV 96 07/01/2022   PLT 286 07/01/2022   Lab Results  Component Value Date   NA 141 07/01/2022   K 4.4 07/01/2022   CO2 27 07/01/2022   GLUCOSE 88 07/01/2022   BUN 12 07/01/2022   CREATININE 0.67 07/01/2022   BILITOT 0.4 07/01/2022   ALKPHOS 39 (L) 07/01/2022   AST 19 07/01/2022   ALT 14 07/01/2022   PROT 5.7 (L) 07/01/2022   ALBUMIN 3.8 (L) 07/01/2022   CALCIUM 9.3 07/01/2022   ANIONGAP 8 08/28/2018   EGFR 108 07/01/2022   Lab Results  Component Value Date   CHOL 184 07/01/2022   Lab Results  Component Value Date   HDL 50 07/01/2022   Lab Results  Component Value Date   LDLCALC 118 (H) 07/01/2022   Lab Results  Component Value Date   TRIG 87 07/01/2022   Lab Results  Component Value Date   CHOLHDL 3.7 07/01/2022   Lab Results  Component Value Date   HGBA1C 5.3 07/01/2022      Assessment & Plan:  Primary  hypertension Assessment & Plan: Controlled She takes losartan 50 mg as needed when her BP is greater than 140/90 She reports not taking her medication today Encouraged adherence with lifestyle modification such as low-sodium diet and increase physical activities BP Readings from Last 3 Encounters:  08/01/22 134/88  07/01/22 124/76  02/27/22 (!) 143/88      Controlled type 2 diabetes mellitus with complication, without long-term current use of insulin ([place]) Assessment & Plan: Will disc discontinue Trulicity 1.5 mg subcu injection daily  Her most recent hemoglobin A1c indicates that she does not have diabetes Encouraged decreasing her intake of processed sugar No polyuria, polydipsia, polyphagia reported Lab Results  Component Value Date   HGBA1C 5.3 07/01/2022      Acne vulgaris Assessment & Plan: The patient reports improvement in her acne since starting benzyl peroxide and Retin-A She also has some questions about usage of both topical treatments Education instructions provided Do not apply benzyl peroxide and Retin-A at the same time apply   Benzyl peroxide (apply in the morning) Benzyl peroxide as a topical agent with antibacterial properties Apply sparingly once daily; gradually increase to 2 to 3 times/day if needed. If excessive dryness or peeling occurs, reduce dose frequency or concentration. If excessive stinging or burning occurs, remove with mild soap and water; resume use the next day  Retin-A (apply in the evening) Retin-A helps decrease facial wrinkles and hyper or hypopigmentation Apply to acne lesions once daily before bedtime or in the evening; apply 20-30 minutes after cleansing the face;Patients should apply a thin layer of the topical retinoid to the entire affected area rather than as a spot treatment to individual lesions. A pea-sized amount of medication is usually sufficient to cover the entire face. Skin should be dry at the time of application        Follow-up: Return in about 3 months (around 10/31/2022).   Alvira Monday, FNP

## 2022-08-01 NOTE — Patient Instructions (Addendum)
I appreciate the opportunity to provide care to you today!    Follow up:  3 months   Do not apply benzyl peroxide and Retin-A at the same time apply   Benzyl peroxide (apply in the morning) Benzyl peroxide as a topical agent with antibacterial properties Apply sparingly once daily; gradually increase to 2 to 3 times/day if needed. If excessive dryness or peeling occurs, reduce dose frequency or concentration. If excessive stinging or burning occurs, remove with mild soap and water; resume use the next day  Retin-A (apply in the evening) Retin-A helps decrease facial wrinkles and hyper or hypopigmentation Apply to acne lesions once daily before bedtime or in the evening; apply 20-30 minutes after cleansing the face;Patients should apply a thin layer of the topical retinoid to the entire affected area rather than as a spot treatment to individual lesions. A pea-sized amount of medication is usually sufficient to cover the entire face. Skin should be dry at the time of application      Please continue to a heart-healthy diet and increase your physical activities. Try to exercise for at least three times a week.      It was a pleasure to see you and I look forward to continuing to work together on your health and well-being. Please do not hesitate to call the office if you need care or have questions about your care.   Have a wonderful day and week. With Gratitude, Gilmore Laroche MSN, FNP-BC

## 2022-08-02 NOTE — Assessment & Plan Note (Signed)
Will disc discontinue Trulicity 1.5 mg subcu injection daily  Her most recent hemoglobin A1c indicates that she does not have diabetes Encouraged decreasing her intake of processed sugar No polyuria, polydipsia, polyphagia reported Lab Results  Component Value Date   HGBA1C 5.3 07/01/2022

## 2022-08-02 NOTE — Assessment & Plan Note (Signed)
Controlled She takes losartan 50 mg as needed when her BP is greater than 140/90 She reports not taking her medication today Encouraged adherence with lifestyle modification such as low-sodium diet and increase physical activities BP Readings from Last 3 Encounters:  08/01/22 134/88  07/01/22 124/76  02/27/22 (!) 143/88

## 2022-08-02 NOTE — Assessment & Plan Note (Signed)
The patient reports improvement in her acne since starting benzyl peroxide and Retin-A She also has some questions about usage of both topical treatments Education instructions provided Do not apply benzyl peroxide and Retin-A at the same time apply   Benzyl peroxide (apply in the morning) Benzyl peroxide as a topical agent with antibacterial properties Apply sparingly once daily; gradually increase to 2 to 3 times/day if needed. If excessive dryness or peeling occurs, reduce dose frequency or concentration. If excessive stinging or burning occurs, remove with mild soap and water; resume use the next day  Retin-A (apply in the evening) Retin-A helps decrease facial wrinkles and hyper or hypopigmentation Apply to acne lesions once daily before bedtime or in the evening; apply 20-30 minutes after cleansing the face;Patients should apply a thin layer of the topical retinoid to the entire affected area rather than as a spot treatment to individual lesions. A pea-sized amount of medication is usually sufficient to cover the entire face. Skin should be dry at the time of application

## 2022-08-25 ENCOUNTER — Emergency Department (HOSPITAL_COMMUNITY)
Admission: EM | Admit: 2022-08-25 | Discharge: 2022-08-25 | Disposition: A | Discharge status: Home / Self Care | Payer: Medicaid Other | Attending: Emergency Medicine | Admitting: Emergency Medicine

## 2022-08-25 ENCOUNTER — Encounter (HOSPITAL_COMMUNITY): Payer: Self-pay | Admitting: Emergency Medicine

## 2022-08-25 ENCOUNTER — Other Ambulatory Visit: Payer: Self-pay

## 2022-08-25 DIAGNOSIS — E119 Type 2 diabetes mellitus without complications: Secondary | ICD-10-CM | POA: Insufficient documentation

## 2022-08-25 DIAGNOSIS — I10 Essential (primary) hypertension: Secondary | ICD-10-CM | POA: Insufficient documentation

## 2022-08-25 DIAGNOSIS — Z79899 Other long term (current) drug therapy: Secondary | ICD-10-CM | POA: Diagnosis not present

## 2022-08-25 DIAGNOSIS — R531 Weakness: Secondary | ICD-10-CM | POA: Diagnosis not present

## 2022-08-25 DIAGNOSIS — J029 Acute pharyngitis, unspecified: Secondary | ICD-10-CM

## 2022-08-25 DIAGNOSIS — Z1152 Encounter for screening for COVID-19: Secondary | ICD-10-CM | POA: Diagnosis not present

## 2022-08-25 DIAGNOSIS — R059 Cough, unspecified: Secondary | ICD-10-CM | POA: Diagnosis present

## 2022-08-25 LAB — RESP PANEL BY RT-PCR (RSV, FLU A&B, COVID)  RVPGX2
Influenza A by PCR: NEGATIVE
Influenza B by PCR: NEGATIVE
Resp Syncytial Virus by PCR: NEGATIVE
SARS Coronavirus 2 by RT PCR: NEGATIVE

## 2022-08-25 LAB — GROUP A STREP BY PCR: Group A Strep by PCR: NOT DETECTED

## 2022-08-25 MED ORDER — IBUPROFEN 400 MG PO TABS
600.0000 mg | ORAL_TABLET | Freq: Once | ORAL | Status: AC
  Administered 2022-08-25: 600 mg via ORAL
  Filled 2022-08-25: qty 2

## 2022-08-25 NOTE — Discharge Instructions (Signed)
Evaluation for your sore throat, cough and weakness is overall reassuring.  It is likely that you have an ongoing viral upper respiratory infection.  At times these can last anywhere from 7 to 14 days.  Please continue conservative treatment at home which includes rest and good hydration most importantly.  If you have new worsening shortness of breath, chest pain, uncontrollable fever, extreme fatigue or any other concerning symptom please return to the emergency department for further evaluation.

## 2022-08-25 NOTE — ED Provider Notes (Signed)
[facility] EMERGENCY DEPARTMENT Provider Note   CSN: [id number] Arrival date & time: 08/25/22  1008     History  Chief Complaint  Patient presents with   Weakness   Cough   HPI Penny Luna is a 47 y.o. female with diabetes and hypertension presenting for weakness and cough and sore throat.  Symptoms started about a week ago.  States her husband and older children have had similar symptoms but have all gotten better.  Endorses some mild shortness of breath.  Last night coughing was so bad she could not sleep which ultimately prompted her to be evaluated in the emergency department.  Has not take any medications for symptoms.  Denies fever.  States weakness is generalized.   Weakness Associated symptoms: cough   Cough      Home Medications Prior to Admission medications   Medication Sig Start Date End Date Taking? Authorizing Provider  Benzoyl Peroxide 2.5 % gel Apply sparingly once daily; gradually increase to 2 to 3 times/day if needed. If excessive dryness or peeling occurs, reduce dose frequency or concentration. If excessive stinging or burning occurs, remove with mild soap and water; resume use the next day. 07/30/22   Alvira Monday, FNP  Calcium Carbonate Antacid (CALCIUM CARBONATE PO) Take 3 capsules by mouth daily.    [provider]  Cyanocobalamin 1000 MCG SUBL Place under the tongue.    [provider]  losartan (COZAAR) 50 MG tablet Take 1 tablet (50 mg total) by mouth daily. 02/27/22   Renee Rival, FNP  Multiple Vitamin (MULTI-VITAMIN) tablet Take 1 tablet by mouth daily.    [provider]  omeprazole (PRILOSEC) 20 MG capsule Take 1 capsule by mouth daily. 03/10/22 03/05/23  [provider]  ondansetron (ZOFRAN-ODT) 8 MG disintegrating tablet Take by mouth. 03/10/22   [provider]  senna-docusate (SENOKOT-S) 8.6-50 MG tablet Take 1 tablet by mouth 2 (two) times daily. 03/10/22 03/10/23  [provider]   tretinoin (RETIN-A) 0.025 % gel Apply to acne lesions once daily before bedtime or in the evening; apply 20-30 minutes after cleansing the face;Patients should apply a thin layer of the topical retinoid to the entire affected area rather than as a spot treatment to individual lesions. A pea-sized amount of medication is usually sufficient to cover the entire face. Skin should be dry at the time of application 22/2/97   Alvira Monday, FNP  TRULICITY 1.5 LG/9.2JJ SOPN Inject 1.5 mg into the skin once a week. 04/08/22   Renee Rival, FNP  ursodiol (ACTIGALL) 250 MG tablet Take by mouth. 03/10/22   [provider]      Allergies    Lactose intolerance (gi)    Review of Systems   Review of Systems  Respiratory:  Positive for cough.   Neurological:  Positive for weakness.    Physical Exam   Vitals:   08/25/22 1056  BP: 136/79  Pulse: 81  Resp: 18  Temp: 98 F (36.7 C)  SpO2: 100%    CONSTITUTIONAL:  well-appearing, NAD NEURO: GCS 15. Speech is goal oriented. No deficits appreciated to CN III-XII; symmetric eyebrow raise, no facial drooping, tongue midline. Patient has equal grip strength bilaterally with 5/5 strength against resistance in all major muscle groups bilaterally. Sensation to light touch intact. Patient moves extremities without ataxia. Normal finger-nose-finger. Patient ambulatory with steady gait. EYES:  eyes equal and reactive ENT/NECK:  Supple, no stridor, posterior pharynx without exudate, erythema noted or swelling CARDIO:  regular  rate and rhythm, appears well-perfused PULM:  No respiratory distress, CTAB GI/GU:  non-distended, soft, non tender MSK/SPINE:  No gross deformities, no edema, moves all extremities  SKIN:  no rash, atraumatic   *Additional and/or pertinent findings included in MDM below    ED Results / Procedures / Treatments   Labs (all labs ordered are listed, but only abnormal results are displayed) Labs Reviewed  RESP PANEL BY  RT-PCR (RSV, FLU A&B, COVID)  [id number]  GROUP A STREP BY PCR    EKG None  Radiology No results found.  Procedures Procedures    Medications Ordered in ED Medications  ibuprofen (ADVIL) tablet 600 mg (has no administration in time range)    ED Course/ Medical Decision Making/ A&P                           Medical Decision Making  47 year old female who is well-appearing presenting for generalized weakness, cough and sore throat.  Physical exam was unremarkable without focal neurodeficit and posterior pharynx appears grossly normal.  It is likely that she has contracted an upper respiratory viral illness especially given that she has been exposed to multiple sick contacts in her family with the same symptoms.  Considered pneumonia but unlikely given lung sounds are normal, vitals are stable and there is no evidence of respiratory distress.  Considered stroke but unlikely given that weakness is generalized and there is no focal neurodeficit on physical exam.  Advised patient that she continue conservative treatment at home.  Advised to follow-up with PCP if her symptoms persist.  Discussed return precautions.  Treated symptoms with ibuprofen.        Final Clinical Impression(s) / ED Diagnoses Final diagnoses:  Cough, unspecified type  Sore throat    Rx / DC Orders ED Discharge Orders     None         Harriet Pho, PA-C 08/25/22 1217    Cristie Hem, MD 08/25/22 [phone]

## 2022-08-25 NOTE — ED Triage Notes (Signed)
Pt c/o cough, gen weakness and sore throat x one week. Nad. Ambulatory. No resp distress or sob noted in triage. A/o

## 2022-10-31 ENCOUNTER — Ambulatory Visit: Payer: Medicaid Other | Admitting: Family Medicine

## 2022-10-31 ENCOUNTER — Encounter: Payer: Self-pay | Admitting: Family Medicine

## 2022-11-19 ENCOUNTER — Encounter: Payer: Self-pay | Admitting: Family Medicine

## 2022-11-19 ENCOUNTER — Ambulatory Visit: Payer: Medicaid Other | Admitting: Family Medicine

## 2022-11-19 VITALS — BP 136/80 | Ht 66.0 in | Wt 238.0 lb

## 2022-11-19 DIAGNOSIS — L700 Acne vulgaris: Secondary | ICD-10-CM

## 2022-11-19 DIAGNOSIS — F439 Reaction to severe stress, unspecified: Secondary | ICD-10-CM | POA: Diagnosis not present

## 2022-11-19 DIAGNOSIS — R7301 Impaired fasting glucose: Secondary | ICD-10-CM | POA: Diagnosis not present

## 2022-11-19 DIAGNOSIS — I10 Essential (primary) hypertension: Secondary | ICD-10-CM

## 2022-11-19 DIAGNOSIS — E559 Vitamin D deficiency, unspecified: Secondary | ICD-10-CM

## 2022-11-19 DIAGNOSIS — E038 Other specified hypothyroidism: Secondary | ICD-10-CM

## 2022-11-19 DIAGNOSIS — E7849 Other hyperlipidemia: Secondary | ICD-10-CM

## 2022-11-19 NOTE — Assessment & Plan Note (Signed)
Controlled She takes losartan 50 mg as needed when her BP is greater than 140/90 She reports not taking her medication today Encouraged adherence with lifestyle modification such as low-sodium diet and increase physical activities BP Readings from Last 3 Encounters:  11/19/22 136/80  08/25/22 136/79  08/01/22 134/88

## 2022-11-19 NOTE — Assessment & Plan Note (Signed)
She follow-up with dermatology on 09/24/2022  She currently takes spironolactone 50 mg daily and Retin-A 0.025% cream Reports doing well with current treatment regimen

## 2022-11-19 NOTE — Progress Notes (Signed)
Established Patient Office Visit  Subjective:  Patient ID: Penny Luna, female    DOB: 1976-05-18  Age: 47 y.o. MRN: [id number]  CC:  Chief Complaint  Patient presents with   Follow-up    3 month f/u. Pt would like a referral to counseling.     HPI Penny Luna is a 47 y.o. female with past medical history of hypertension and acne vulgaris presents for f/u of  chronic medical conditions. For the details of today's visit, please refer to the assessment and plan.     Past Medical History:  Diagnosis Date   Diabetes mellitus without complication ([place])    Encounter for sterilization 08/28/2013   H/O: cesarean section 08/28/2013   Hypertension    Obese    S/P cesarean section 08/29/2013    Past Surgical History:  Procedure Laterality Date   CESAREAN SECTION     CESAREAN SECTION WITH BILATERAL TUBAL LIGATION Bilateral 08/29/2013   Procedure: CESAREAN SECTION WITH BILATERAL TUBAL LIGATION;  Surgeon: Thornell Sartorius, MD;  Location: [facility];  Service: Obstetrics;  Laterality: Bilateral;   GASTRIC BYPASS      Family History  Problem Relation Age of Onset   Cancer Mother    Hypertension Mother    Cervical cancer Mother    Colon cancer Father    Diabetes Maternal Aunt    Diabetes Cousin     Social History   Socioeconomic History   Marital status: Married    Spouse name: Not on file   Number of children: 4   Years of education: Not on file   Highest education level: Not on file  Occupational History   Not on file  Tobacco Use   Smoking status: Never   Smokeless tobacco: Never  Vaping Use   Vaping Use: Never used  Substance and Sexual Activity   Alcohol use: Yes    Comment: occ   Drug use: No   Sexual activity: Yes  Other Topics Concern   Not on file  Social History Narrative   Lives with her husband and four children    Social Determinants of Health   Financial Resource Strain: Not on file  Food Insecurity: Not on file  Transportation Needs: Not on  file  Physical Activity: Not on file  Stress: Not on file  Social Connections: Not on file  Intimate Partner Violence: Not on file    Outpatient Medications Prior to Visit  Medication Sig Dispense Refill   Benzoyl Peroxide 2.5 % gel Apply sparingly once daily; gradually increase to 2 to 3 times/day if needed. If excessive dryness or peeling occurs, reduce dose frequency or concentration. If excessive stinging or burning occurs, remove with mild soap and water; resume use the next day. 42.5 g 0   Calcium Carbonate Antacid (CALCIUM CARBONATE PO) Take 3 capsules by mouth daily.     Cyanocobalamin 1000 MCG SUBL Place under the tongue.     losartan (COZAAR) 50 MG tablet Take 1 tablet (50 mg total) by mouth daily. 90 tablet 0   Multiple Vitamin (MULTI-VITAMIN) tablet Take 1 tablet by mouth daily.     omeprazole (PRILOSEC) 20 MG capsule Take 1 capsule by mouth daily.     ondansetron (ZOFRAN-ODT) 8 MG disintegrating tablet Take by mouth.     senna-docusate (SENOKOT-S) 8.6-50 MG tablet Take 1 tablet by mouth 2 (two) times daily.     spironolactone (ALDACTONE) 50 MG tablet Take 1 tablet by mouth daily.     tretinoin (RETIN-A) 0.025 %  gel Apply to acne lesions once daily before bedtime or in the evening; apply 20-30 minutes after cleansing the face;Patients should apply a thin layer of the topical retinoid to the entire affected area rather than as a spot treatment to individual lesions. A pea-sized amount of medication is usually sufficient to cover the entire face. Skin should be dry at the time of application 45 g 0   TRULICITY 1.5 0000000 SOPN Inject 1.5 mg into the skin once a week. 0.5 mL 3   ursodiol (ACTIGALL) 250 MG tablet Take by mouth.     No facility-administered medications prior to visit.    Allergies  Allergen Reactions   Lactose Intolerance (Gi) Nausea Only    ROS Review of Systems  Constitutional:  Negative for chills and fever.  Eyes:  Negative for visual disturbance.   Respiratory:  Negative for chest tightness and shortness of breath.   Neurological:  Negative for dizziness and headaches.      Objective:    Physical Exam HENT:     Head: Normocephalic.     Mouth/Throat:     Mouth: Mucous membranes are moist.  Cardiovascular:     Rate and Rhythm: Normal rate.     Heart sounds: Normal heart sounds.  Pulmonary:     Effort: Pulmonary effort is normal.     Breath sounds: Normal breath sounds.  Neurological:     Mental Status: She is alert.     BP 136/80 (BP Location: Left Arm)   Ht 5\' 6"  (1.676 m)   Wt 238 lb 0.6 oz (108 kg)   SpO2 99%   BMI 38.42 kg/m  Wt Readings from Last 3 Encounters:  11/19/22 238 lb 0.6 oz (108 kg)  08/01/22 254 lb 0.6 oz (115.2 kg)  07/01/22 262 lb 1.3 oz (118.9 kg)    Lab Results  Component Value Date   TSH 1.040 07/01/2022   Lab Results  Component Value Date   WBC 4.3 07/01/2022   HGB 12.0 07/01/2022   HCT 36.9 07/01/2022   MCV 96 07/01/2022   PLT 286 07/01/2022   Lab Results  Component Value Date   NA 141 07/01/2022   K 4.4 07/01/2022   CO2 27 07/01/2022   GLUCOSE 88 07/01/2022   BUN 12 07/01/2022   CREATININE 0.67 07/01/2022   BILITOT 0.4 07/01/2022   ALKPHOS 39 (L) 07/01/2022   AST 19 07/01/2022   ALT 14 07/01/2022   PROT 5.7 (L) 07/01/2022   ALBUMIN 3.8 (L) 07/01/2022   CALCIUM 9.3 07/01/2022   ANIONGAP 8 08/28/2018   EGFR 108 07/01/2022   Lab Results  Component Value Date   CHOL 184 07/01/2022   Lab Results  Component Value Date   HDL 50 07/01/2022   Lab Results  Component Value Date   LDLCALC 118 (H) 07/01/2022   Lab Results  Component Value Date   TRIG 87 07/01/2022   Lab Results  Component Value Date   CHOLHDL 3.7 07/01/2022   Lab Results  Component Value Date   HGBA1C 5.3 07/01/2022      Assessment & Plan:  Primary hypertension Assessment & Plan: Controlled She takes losartan 50 mg as needed when her BP is greater than 140/90 She reports not taking her  medication today Encouraged adherence with lifestyle modification such as low-sodium diet and increase physical activities BP Readings from Last 3 Encounters:  11/19/22 136/80  08/25/22 136/79  08/01/22 134/88      Stress Assessment & Plan: Reports marital  stress Will like to speak with a therapist Referral placed   Orders: -     Ambulatory referral to Psychiatry  Acne vulgaris Assessment & Plan: She follow-up with dermatology on 09/24/2022  She currently takes spironolactone 50 mg daily and Retin-A 0.025% cream Reports doing well with current treatment regimen    IFG (impaired fasting glucose) -     Hemoglobin A1c  Vitamin D deficiency -     VITAMIN D 25 Hydroxy (Vit-D Deficiency, Fractures)  Other specified hypothyroidism -     TSH + free T4  Other hyperlipidemia -     Lipid panel -     CMP14+EGFR -     CBC with Differential/Platelet    Follow-up: Return in about 4 months (around 03/21/2023).   Alvira Monday, FNP

## 2022-11-19 NOTE — Assessment & Plan Note (Signed)
Not on pharmacological therapy Encouraged decreasing her intake of processed sugar No polyuria, polydipsia, polyphagia reported Lab Results  Component Value Date   HGBA1C 5.3 07/01/2022

## 2022-11-19 NOTE — Assessment & Plan Note (Addendum)
Reports marital stress Will like to speak with a therapist Referral placed

## 2022-11-19 NOTE — Patient Instructions (Addendum)
I appreciate the opportunity to provide care to you today!    Follow up:  4 months  Labs: please stop by the lab during the week  to get your blood drawn (CBC, CMP, TSH, Lipid profile, HgA1c, Vit D)     Referral: Maurice Small for talk therapy    Please continue to a heart-healthy diet and increase your physical activities. Try to exercise for 23mins at least five days a week.      It was a pleasure to see you and I look forward to continuing to work together on your health and well-being. Please do not hesitate to call the office if you need care or have questions about your care.   Have a wonderful day and week. With Gratitude, Alvira Monday MSN, FNP-BC

## 2022-11-25 LAB — CBC WITH DIFFERENTIAL/PLATELET
Basophils Absolute: 0 10*3/uL (ref 0.0–0.2)
Basos: 1 %
EOS (ABSOLUTE): 0 10*3/uL (ref 0.0–0.4)
Eos: 1 %
Hematocrit: 35.4 % (ref 34.0–46.6)
Hemoglobin: 11.8 g/dL (ref 11.1–15.9)
Immature Grans (Abs): 0 10*3/uL (ref 0.0–0.1)
Immature Granulocytes: 0 %
Lymphocytes Absolute: 2.2 10*3/uL (ref 0.7–3.1)
Lymphs: 58 %
MCH: 31.3 pg (ref 26.6–33.0)
MCHC: 33.3 g/dL (ref 31.5–35.7)
MCV: 94 fL (ref 79–97)
Monocytes Absolute: 0.4 10*3/uL (ref 0.1–0.9)
Monocytes: 12 %
Neutrophils Absolute: 1 10*3/uL — ABNORMAL LOW (ref 1.4–7.0)
Neutrophils: 28 %
Platelets: 259 10*3/uL (ref 150–450)
RBC: 3.77 x10E6/uL (ref 3.77–5.28)
RDW: 14.5 % (ref 11.7–15.4)
WBC: 3.7 10*3/uL (ref 3.4–10.8)

## 2022-11-25 LAB — LIPID PANEL
Chol/HDL Ratio: 3 ratio (ref 0.0–4.4)
Cholesterol, Total: 189 mg/dL (ref 100–199)
HDL: 64 mg/dL (ref >39)
LDL Chol Calc (NIH): 112 mg/dL — ABNORMAL HIGH (ref 0–99)
Triglycerides: 68 mg/dL (ref 0–149)
VLDL Cholesterol Cal: 13 mg/dL (ref 5–40)

## 2022-11-25 LAB — CMP14+EGFR
ALT: 14 IU/L (ref 0–32)
AST: 18 IU/L (ref 0–40)
Albumin/Globulin Ratio: 1.9 (ref 1.2–2.2)
Albumin: 3.7 g/dL — ABNORMAL LOW (ref 3.9–4.9)
Alkaline Phosphatase: 35 IU/L — ABNORMAL LOW (ref 44–121)
BUN/Creatinine Ratio: 17 (ref 9–23)
BUN: 12 mg/dL (ref 6–24)
Bilirubin Total: 0.4 mg/dL (ref 0.0–1.2)
CO2: 24 mmol/L (ref 20–29)
Calcium: 9 mg/dL (ref 8.7–10.2)
Chloride: 107 mmol/L — ABNORMAL HIGH (ref 96–106)
Creatinine, Ser: 0.7 mg/dL (ref 0.57–1.00)
Globulin, Total: 2 g/dL (ref 1.5–4.5)
Glucose: 93 mg/dL (ref 70–99)
Potassium: 4.2 mmol/L (ref 3.5–5.2)
Sodium: 142 mmol/L (ref 134–144)
Total Protein: 5.7 g/dL — ABNORMAL LOW (ref 6.0–8.5)
eGFR: 107 mL/min/{1.73_m2} (ref >59)

## 2022-11-25 LAB — TSH+FREE T4
Free T4: 1.22 ng/dL (ref 0.82–1.77)
TSH: 1.2 u[IU]/mL (ref 0.450–4.500)

## 2022-11-25 LAB — VITAMIN D 25 HYDROXY (VIT D DEFICIENCY, FRACTURES): Vit D, 25-Hydroxy: 34.5 ng/mL (ref 30.0–100.0)

## 2022-11-25 LAB — HEMOGLOBIN A1C
Est. average glucose Bld gHb Est-mCnc: 108 mg/dL
Hgb A1c MFr Bld: 5.4 % (ref 4.8–5.6)

## 2022-11-25 NOTE — Progress Notes (Signed)
  Your cholesterol levels have improved from 118 to 112. I want your LDL cholesterol to be less than 100.  I recommend avoiding simple carbohydrates, including cakes, sweet desserts, ice cream, soda (diet or regular), sweet tea, candies, chips, cookies, store-bought juices, alcohol in excess of 1-2 drinks a day, lemonade, artificial sweeteners, donuts, coffee creamers, and sugar-free products.  I recommend avoiding greasy, fatty foods with increased physical activity.  All other labs are stable

## 2023-01-21 ENCOUNTER — Ambulatory Visit: Payer: 59

## 2023-01-21 LAB — HM DIABETES EYE EXAM

## 2023-02-16 ENCOUNTER — Encounter (HOSPITAL_COMMUNITY): Payer: Self-pay | Admitting: Psychiatry

## 2023-02-16 ENCOUNTER — Ambulatory Visit (INDEPENDENT_AMBULATORY_CARE_PROVIDER_SITE_OTHER): Payer: 59 | Admitting: Psychiatry

## 2023-02-16 DIAGNOSIS — F321 Major depressive disorder, single episode, moderate: Secondary | ICD-10-CM

## 2023-02-16 DIAGNOSIS — E785 Hyperlipidemia, unspecified: Secondary | ICD-10-CM | POA: Insufficient documentation
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
## 2023-03-23 ENCOUNTER — Ambulatory Visit: Payer: 59 | Admitting: Family Medicine

## 2023-03-24 ENCOUNTER — Ambulatory Visit (INDEPENDENT_AMBULATORY_CARE_PROVIDER_SITE_OTHER): Payer: 59 | Admitting: Family Medicine

## 2023-03-24 ENCOUNTER — Encounter: Payer: Self-pay | Admitting: Family Medicine

## 2023-03-24 VITALS — BP 148/82 | HR 83 | Ht 66.0 in | Wt 220.1 lb

## 2023-03-24 DIAGNOSIS — I10 Essential (primary) hypertension: Secondary | ICD-10-CM

## 2023-03-24 DIAGNOSIS — R7301 Impaired fasting glucose: Secondary | ICD-10-CM | POA: Diagnosis not present

## 2023-03-24 DIAGNOSIS — E038 Other specified hypothyroidism: Secondary | ICD-10-CM | POA: Diagnosis not present

## 2023-03-24 DIAGNOSIS — E7849 Other hyperlipidemia: Secondary | ICD-10-CM

## 2023-03-24 DIAGNOSIS — E559 Vitamin D deficiency, unspecified: Secondary | ICD-10-CM | POA: Diagnosis not present

## 2023-03-24 DIAGNOSIS — Z1159 Encounter for screening for other viral diseases: Secondary | ICD-10-CM

## 2023-03-24 MED ORDER — LOSARTAN POTASSIUM 50 MG PO TABS: 50.0000 mg | ORAL_TABLET | Freq: Every day | ORAL | 1 refills | Status: DC

## 2023-03-24 NOTE — Patient Instructions (Addendum)
I appreciate the opportunity to provide care to you today!    Follow up:  4 months  Fasting Labs: please stop by the lab today/during the week to get your blood drawn (CBC, CMP, TSH, Lipid profile, HgA1c, Vit D)   Attached with your AVS, you will find valuable resources for self-education. I highly recommend dedicating some time to thoroughly examine them.   Please continue to a heart-healthy diet and increase your physical activities. Try to exercise for at least five days a week.    It was a pleasure to see you and I look forward to continuing to work together on your health and well-being. Please do not hesitate to call the office if you need care or have questions about your care.  In case of emergency, please visit the Emergency Department for urgent care, or contact our clinic at [phone] to schedule an appointment. We're here to help you!   Have a wonderful day and week. With Gratitude, Gilmore Laroche MSN, FNP-BC

## 2023-03-24 NOTE — Progress Notes (Signed)
Established Patient Office Visit  Subjective:  Patient ID: Penny Luna, female    DOB: [birth year]  Age: 47 y.o. MRN: [id number]  CC:  Chief Complaint  Patient presents with   Care Management    Follow up appt.    HPI Penny Luna is a 47 y.o. female with past medical history of hypertension presents for f/u. For the details of today's visit, please refer to the assessment and plan.     Past Medical History:  Diagnosis Date   Diabetes mellitus without complication (HCC)    Encounter for sterilization 08/28/2013   H/O: cesarean section 08/28/2013   Hyperlipidemia    Hypertension    Obese    S/P cesarean section 08/29/2013    Past Surgical History:  Procedure Laterality Date   CESAREAN SECTION     CESAREAN SECTION WITH BILATERAL TUBAL LIGATION Bilateral 08/29/2013   Procedure: CESAREAN SECTION WITH BILATERAL TUBAL LIGATION;  Surgeon: Sherron Monday, MD;  Location: [facility];  Service: Obstetrics;  Laterality: Bilateral;   GASTRIC BYPASS      Family History  Problem Relation Age of Onset   Anxiety disorder Mother    Cancer Mother    Hypertension Mother    Cervical cancer Mother    Colon cancer Father    Diabetes Maternal Aunt    Diabetes Cousin     Social History   Socioeconomic History   Marital status: Married    Spouse name: Not on file   Number of children: 4   Years of education: Not on file   Highest education level: Not on file  Occupational History   Not on file  Tobacco Use   Smoking status: Never   Smokeless tobacco: Never  Vaping Use   Vaping status: Never Used  Substance and Sexual Activity   Alcohol use: Yes    Comment: occ   Drug use: No   Sexual activity: Not Currently  Other Topics Concern   Not on file  Social History Narrative   Lives with her husband and four children    Social Determinants of Health   Financial Resource Strain: Not on file  Food Insecurity: No Food Insecurity (04/22/2022)   [facility] prior to 10[facility]25[facility] prior to 10/25/2022.   Hunger Vital Sign    Worried About Running Out of Food in the Last Year: Never true    Ran Out of Food in the Last Year: Never true  Transportation Needs: Not on file  Physical Activity: Insufficiently Active (11/19/2020)   [facility], [facility]   Exercise Vital Sign    Days of Exercise per Week: 3 days    Minutes of Exercise per Session: 30 min  Stress: Stress Concern Present (11/19/2020)   [facility], [facility] of Occupational Health - Occupational Stress Questionnaire    Feeling of Stress : To some extent  Social Connections: Unknown (08/13/2022)   Received from [facility], [facility]   Social Network    Social Network: Not on file  Intimate Partner Violence: Unknown (08/13/2022)   Received from [facility], [facility]   HITS    Physically Hurt: Not on file    Insult or Talk Down To: Not on file    Threaten Physical Harm: Not on file    Scream or Curse: Not on file  Outpatient Medications Prior to Visit  Medication Sig Dispense Refill   Benzoyl Peroxide 2.5 % gel Apply sparingly once daily; gradually increase to 2 to 3 times/day if needed. If excessive dryness or peeling occurs, reduce dose frequency or concentration. If excessive stinging or burning occurs, remove with mild soap and water; resume use the next day. 42.5 g 0   Calcium Carbonate Antacid (CALCIUM CARBONATE PO) Take 3 capsules by mouth daily.     Cyanocobalamin 1000 MCG SUBL Place under the tongue.     Multiple Vitamin (MULTI-VITAMIN) tablet Take 1 tablet by mouth daily.     ondansetron (ZOFRAN-ODT) 8 MG disintegrating tablet Take by mouth.     spironolactone (ALDACTONE) 50 MG tablet Take 1 tablet by mouth daily.     tretinoin (RETIN-A) 0.025 % gel Apply to acne lesions once daily before bedtime or  in the evening; apply 20-30 minutes after cleansing the face;Patients should apply a thin layer of the topical retinoid to the entire affected area rather than as a spot treatment to individual lesions. A pea-sized amount of medication is usually sufficient to cover the entire face. Skin should be dry at the time of application 45 g 0   TRULICITY 1.5 MG/0.5ML SOPN Inject 1.5 mg into the skin once a week. 0.5 mL 3   ursodiol (ACTIGALL) 250 MG tablet Take by mouth.     losartan (COZAAR) 50 MG tablet Take 1 tablet (50 mg total) by mouth daily. 90 tablet 0   omeprazole (PRILOSEC) 20 MG capsule Take 1 capsule by mouth daily.     No facility-administered medications prior to visit.    Allergies  Allergen Reactions   Lactose Intolerance (Gi) Nausea Only    ROS Review of Systems  Constitutional:  Negative for chills and fever.  Eyes:  Negative for visual disturbance.  Respiratory:  Negative for chest tightness and shortness of breath.   Neurological:  Negative for dizziness and headaches.      Objective:    Physical Exam HENT:     Head: Normocephalic.     Mouth/Throat:     Mouth: Mucous membranes are moist.  Cardiovascular:     Rate and Rhythm: Normal rate.     Heart sounds: Normal heart sounds.  Pulmonary:     Effort: Pulmonary effort is normal.     Breath sounds: Normal breath sounds.  Neurological:     Mental Status: She is alert.     BP (!) 148/82   Pulse 83   Ht 5\' 6"  (1.676 m)   Wt 220 lb 1.9 oz (99.8 kg)   SpO2 95%   BMI 35.53 kg/m  Wt Readings from Last 3 Encounters:  03/24/23 220 lb 1.9 oz (99.8 kg)  11/19/22 238 lb 0.6 oz (108 kg)  08/01/22 254 lb 0.6 oz (115.2 kg)    Lab Results  Component Value Date   TSH 1.200 11/24/2022   Lab Results  Component Value Date   WBC 3.7 11/24/2022   HGB 11.8 11/24/2022   HCT 35.4 11/24/2022   MCV 94 11/24/2022   PLT 259 11/24/2022   Lab Results  Component Value Date   NA 142 11/24/2022   K 4.2 11/24/2022   CO2 24  11/24/2022   GLUCOSE 93 11/24/2022   BUN 12 11/24/2022   CREATININE 0.70 11/24/2022   BILITOT 0.4 11/24/2022   ALKPHOS 35 (L) 11/24/2022   AST 18 11/24/2022   ALT 14 11/24/2022   PROT 5.7 (L) 11/24/2022   ALBUMIN  3.7 (L) 11/24/2022   CALCIUM 9.0 11/24/2022   ANIONGAP 8 08/28/2018   EGFR 107 11/24/2022   Lab Results  Component Value Date   CHOL 189 11/24/2022   Lab Results  Component Value Date   HDL 64 11/24/2022   Lab Results  Component Value Date   LDLCALC 112 (H) 11/24/2022   Lab Results  Component Value Date   TRIG 68 11/24/2022   Lab Results  Component Value Date   CHOLHDL 3.0 11/24/2022   Lab Results  Component Value Date   HGBA1C 5.4 11/24/2022      Assessment & Plan:  Hypertension, unspecified type Assessment & Plan: Uncontrolled C/o increased life stresses She takes losartan 50 mg as needed when her BP is greater than 140/90 Reports not  taking her medication today Encouraged adherence with lifestyle modification such as low-sodium diet and increase physical activities BP Readings from Last 3 Encounters:  03/24/23 (!) 148/82  11/19/22 136/80  08/25/22 136/79     Orders: -     Losartan Potassium; Take 1 tablet (50 mg total) by mouth daily.  Dispense: 90 tablet; Refill: 1  IFG (impaired fasting glucose) -     Hemoglobin A1c  Vitamin D deficiency -     VITAMIN D 25 Hydroxy (Vit-D Deficiency, Fractures)  Other specified hypothyroidism -     TSH + free T4  Other hyperlipidemia -     Lipid panel -     CMP14+EGFR -     CBC with Differential/Platelet  Need for hepatitis C screening test -     Hepatitis C antibody  Note: This chart has been completed using Engineer, civil (consulting) software, and while attempts have been made to ensure accuracy, certain words and phrases may not be transcribed as intended.    Follow-up: Return in about 4 months (around 07/25/2023).   Gilmore Laroche, FNP

## 2023-03-27 NOTE — Assessment & Plan Note (Signed)
Uncontrolled C/o increased life stresses She takes losartan 50 mg as needed when her BP is greater than 140/90 Reports not  taking her medication today Encouraged adherence with lifestyle modification such as low-sodium diet and increase physical activities BP Readings from Last 3 Encounters:  03/24/23 (!) 148/82  11/19/22 136/80  08/25/22 136/79

## 2023-04-09 ENCOUNTER — Ambulatory Visit (INDEPENDENT_AMBULATORY_CARE_PROVIDER_SITE_OTHER): Payer: 59 | Admitting: Psychiatry

## 2023-04-09 DIAGNOSIS — F321 Major depressive disorder, single episode, moderate: Secondary | ICD-10-CM
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
## 2023-04-23 ENCOUNTER — Encounter (HOSPITAL_COMMUNITY): Payer: Self-pay

## 2023-04-23 ENCOUNTER — Ambulatory Visit (INDEPENDENT_AMBULATORY_CARE_PROVIDER_SITE_OTHER): Payer: 59 | Admitting: Psychiatry

## 2023-04-23 DIAGNOSIS — F321 Major depressive disorder, single episode, moderate: Secondary | ICD-10-CM
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
## 2023-05-07 ENCOUNTER — Ambulatory Visit (HOSPITAL_COMMUNITY): Payer: 59 | Admitting: Psychiatry

## 2023-06-04 ENCOUNTER — Ambulatory Visit (INDEPENDENT_AMBULATORY_CARE_PROVIDER_SITE_OTHER): Payer: Medicaid Other | Admitting: Psychiatry

## 2023-06-04 DIAGNOSIS — F321 Major depressive disorder, single episode, moderate: Secondary | ICD-10-CM | POA: Diagnosis not present
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
## 2023-06-18 ENCOUNTER — Ambulatory Visit (HOSPITAL_COMMUNITY): Payer: Medicaid Other | Admitting: Psychiatry

## 2023-06-18 DIAGNOSIS — F321 Major depressive disorder, single episode, moderate: Secondary | ICD-10-CM | POA: Diagnosis not present
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
## 2023-06-22 ENCOUNTER — Ambulatory Visit (HOSPITAL_COMMUNITY): Payer: 59 | Admitting: Psychiatry

## 2023-07-08 ENCOUNTER — Ambulatory Visit (INDEPENDENT_AMBULATORY_CARE_PROVIDER_SITE_OTHER): Payer: Medicaid Other | Admitting: Psychiatry

## 2023-07-08 DIAGNOSIS — F321 Major depressive disorder, single episode, moderate: Secondary | ICD-10-CM | POA: Diagnosis not present
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
## 2023-07-27 ENCOUNTER — Ambulatory Visit: Payer: 59 | Admitting: Family Medicine

## 2023-08-05 ENCOUNTER — Encounter: Payer: Self-pay | Admitting: Family Medicine

## 2023-08-20 ENCOUNTER — Telehealth: Payer: Self-pay | Admitting: Psychiatry

## 2023-08-20 ENCOUNTER — Ambulatory Visit (HOSPITAL_COMMUNITY): Payer: Medicaid Other | Admitting: Psychiatry

## 2023-08-20 DIAGNOSIS — F321 Major depressive disorder, single episode, moderate: Secondary | ICD-10-CM | POA: Diagnosis not present

## 2023-08-20 NOTE — Telephone Encounter (Signed)
done

## 2023-08-20 NOTE — Telephone Encounter (Signed)
Copied from CRM [id number]. Topic: Appointments - Scheduling Inquiry for Clinic >> Aug 20, 2023  4:11 PM Antony Haste wrote:  Reason for CRM: PT states she was referred to call and speak with Florencia Reasons about scheduling an appointment for therapy? Callback [phone]
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
## 2023-09-08 ENCOUNTER — Ambulatory Visit (INDEPENDENT_AMBULATORY_CARE_PROVIDER_SITE_OTHER): Payer: Medicaid Other | Admitting: Psychiatry

## 2023-09-08 DIAGNOSIS — F321 Major depressive disorder, single episode, moderate: Secondary | ICD-10-CM
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
## 2023-09-22 ENCOUNTER — Ambulatory Visit (INDEPENDENT_AMBULATORY_CARE_PROVIDER_SITE_OTHER): Payer: Medicaid Other | Admitting: Psychiatry

## 2023-09-22 DIAGNOSIS — F321 Major depressive disorder, single episode, moderate: Secondary | ICD-10-CM | POA: Diagnosis not present
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
## 2023-10-07 ENCOUNTER — Telehealth (HOSPITAL_COMMUNITY): Payer: Self-pay | Admitting: Psychiatry

## 2023-10-07 ENCOUNTER — Ambulatory Visit (HOSPITAL_COMMUNITY): Payer: Medicaid Other | Admitting: Psychiatry

## 2023-10-07 NOTE — Telephone Encounter (Signed)
Therapist attempted to contact patient via phone for scheduled telephone appointment and received voicemail recording.  Therapist left message indicating attempt and requesting patient call office.

## 2023-10-19 ENCOUNTER — Encounter: Payer: Self-pay | Admitting: Family Medicine

## 2023-10-19 ENCOUNTER — Ambulatory Visit: Payer: Medicaid Other | Admitting: Family Medicine

## 2023-10-19 VITALS — BP 130/82 | HR 85 | Ht 66.0 in | Wt 204.0 lb

## 2023-10-19 DIAGNOSIS — I10 Essential (primary) hypertension: Secondary | ICD-10-CM

## 2023-10-19 DIAGNOSIS — B349 Viral infection, unspecified: Secondary | ICD-10-CM

## 2023-10-19 DIAGNOSIS — R7301 Impaired fasting glucose: Secondary | ICD-10-CM

## 2023-10-19 DIAGNOSIS — Z7985 Long-term (current) use of injectable non-insulin antidiabetic drugs: Secondary | ICD-10-CM

## 2023-10-19 DIAGNOSIS — E1169 Type 2 diabetes mellitus with other specified complication: Secondary | ICD-10-CM

## 2023-10-19 DIAGNOSIS — E038 Other specified hypothyroidism: Secondary | ICD-10-CM

## 2023-10-19 DIAGNOSIS — E119 Type 2 diabetes mellitus without complications: Secondary | ICD-10-CM

## 2023-10-19 DIAGNOSIS — E7849 Other hyperlipidemia: Secondary | ICD-10-CM

## 2023-10-19 DIAGNOSIS — E559 Vitamin D deficiency, unspecified: Secondary | ICD-10-CM

## 2023-10-19 MED ORDER — PROMETHAZINE-DM 6.25-15 MG/5ML PO SYRP: 5.0000 mL | ORAL_SOLUTION | Freq: Four times a day (QID) | ORAL | 0 refills | Status: DC | PRN

## 2023-10-19 NOTE — Progress Notes (Unsigned)
 Established Patient Office Visit  Subjective:  Patient ID: Penny Luna [facility], female    DOB: [birth year]  Age: 48 y.o. MRN: [id number]  CC:  Chief Complaint  Patient presents with   Follow-up    4 month   Cough    X 1 wk w/ congestion, pressure in ears and sinuses.     HPI Penny Luna Penny Luna Penny Luna is a 48 y.o. female with past medical history of HTN, T2DM and HLP presents for f/u of  chronic medical conditions. For the details of today's visit, please refer to the assessment and plan.   Past Medical History:  Diagnosis Date   Diabetes mellitus without complication (HCC)    Encounter for sterilization 08/28/2013   H/O: cesarean section 08/28/2013   Hyperlipidemia    Hypertension    Obese    S/P cesarean section 08/29/2013    Past Surgical History:  Procedure Laterality Date   CESAREAN SECTION     CESAREAN SECTION WITH BILATERAL TUBAL LIGATION Bilateral 08/29/2013   Procedure: CESAREAN SECTION WITH BILATERAL TUBAL LIGATION;  Surgeon: Sherron Monday, MD;  Location: [facility];  Service: Obstetrics;  Laterality: Bilateral;   GASTRIC BYPASS      Family History  Problem Relation Age of Onset   Anxiety disorder Mother    Cancer Mother    Hypertension Mother    Cervical cancer Mother    Colon cancer Father    Diabetes Maternal Aunt    Diabetes Cousin     Social History   Socioeconomic History   Marital status: Married    Spouse name: Not on file   Number of children: 4   Years of education: Not on file   Highest education level: Not on file  Occupational History   Not on file  Tobacco Use   Smoking status: Never   Smokeless tobacco: Never  Vaping Use   Vaping status: Never Used  Substance and Sexual Activity   Alcohol use: Yes    Comment: occ   Drug use: No   Sexual activity: Not Currently  Other Topics Concern   Not on file  Social History Narrative   Lives with her husband and four children    Social Drivers of Corporate investment banker  Strain: Not on file  Food Insecurity: No Food Insecurity (04/22/2022)   [facility] prior to 10/25/2022., [facility]   Hunger Vital Sign    Worried About Running Out of Food in the Last Year: Never true    Ran Out of Food in the Last Year: Never true  Transportation Needs: Not on file  Physical Activity: Insufficiently Active (11/19/2020)   Received from [facility], [facility]   Exercise Vital Sign    Days of Exercise per Week: 3 days    Minutes of Exercise per Session: 30 min  Stress: Stress Concern Present (11/19/2020)   Received from [facility], [facility] - Occupational Stress Questionnaire    Feeling of Stress : To some extent  Social Connections: Unknown (08/13/2022)   Received from [facility], [facility]   Social Network    Social Network: Not on file  Intimate Partner Violence: Unknown (08/13/2022)   Received from [facility], [facility]   HITS    Physically Hurt: Not on file    Insult or  Talk Down To: Not on file    Threaten Physical Harm: Not on file    Scream or Curse: Not on file    Outpatient Medications Prior to Visit  Medication Sig Dispense Refill   Semaglutide-Weight Management (WEGOVY) 1.7 MG/0.75ML SOAJ Inject 1.7 mg into the skin once a week.     Benzoyl Peroxide 2.5 % gel Apply sparingly once daily; gradually increase to 2 to 3 times/day if needed. If excessive dryness or peeling occurs, reduce dose frequency or concentration. If excessive stinging or burning occurs, remove with mild soap and water; resume use the next day. 42.5 g 0   Calcium Carbonate Antacid (CALCIUM CARBONATE PO) Take 3 capsules by mouth daily.     Cyanocobalamin 1000 MCG SUBL Place under the tongue.     losartan (COZAAR) 50 MG tablet Take 1 tablet (50 mg total) by mouth daily. 90 tablet 1    losartan-hydrochlorothiazide (HYZAAR) 50-12.5 MG tablet Take 1 tablet by mouth daily.     Multiple Vitamin (MULTI-VITAMIN) tablet Take 1 tablet by mouth daily.     ondansetron (ZOFRAN-ODT) 8 MG disintegrating tablet Take by mouth.     spironolactone (ALDACTONE) 50 MG tablet Take 1 tablet by mouth daily.     tretinoin (RETIN-A) 0.025 % gel Apply to acne lesions once daily before bedtime or in the evening; apply 20-30 minutes after cleansing the face;Patients should apply a thin layer of the topical retinoid to the entire affected area rather than as a spot treatment to individual lesions. A pea-sized amount of medication is usually sufficient to cover the entire face. Skin should be dry at the time of application 45 g 0   TRULICITY 1.5 MG/0.5ML SOPN Inject 1.5 mg into the skin once a week. 0.5 mL 3   ursodiol (ACTIGALL) 250 MG tablet Take by mouth.     omeprazole (PRILOSEC) 20 MG capsule Take 1 capsule by mouth daily.     No facility-administered medications prior to visit.    Allergies  Allergen Reactions   Lactose Intolerance (Gi) Nausea Only    ROS Review of Systems  Constitutional:  Negative for chills and fever.  Eyes:  Negative for visual disturbance.  Respiratory:  Negative for chest tightness and shortness of breath.   Neurological:  Negative for dizziness and headaches.      Objective:    Physical Exam HENT:     Head: Normocephalic.     Mouth/Throat:     Mouth: Mucous membranes are moist.  Cardiovascular:     Rate and Rhythm: Normal rate.     Heart sounds: Normal heart sounds.  Pulmonary:     Effort: Pulmonary effort is normal.     Breath sounds: Normal breath sounds.  Neurological:     Mental Status: She is alert.     BP 130/82   Pulse 85   Ht 5\' 6"  (1.676 m)   Wt 204 lb 0.6 oz (92.6 kg)   LMP 09/28/2023 (Approximate)   SpO2 98%   BMI 32.93 kg/m  Wt Readings from Last 3 Encounters:  10/19/23 204 lb 0.6 oz (92.6 kg)  03/24/23 220 lb 1.9 oz (99.8 kg)   11/19/22 238 lb 0.6 oz (108 kg)    Lab Results  Component Value Date   TSH 1.240 03/27/2023   Lab Results  Component Value Date   WBC 4.0 03/27/2023   HGB 12.1 03/27/2023   HCT 34.9 03/27/2023   MCV 92 03/27/2023   PLT 271 03/27/2023   Lab  Results  Component Value Date   NA 141 03/27/2023   K 4.4 03/27/2023   CO2 25 03/27/2023   GLUCOSE 95 03/27/2023   BUN 10 03/27/2023   CREATININE 0.68 03/27/2023   BILITOT 0.6 03/27/2023   ALKPHOS 45 03/27/2023   AST 20 03/27/2023   ALT 17 03/27/2023   PROT 5.6 (L) 03/27/2023   ALBUMIN 3.7 (L) 03/27/2023   CALCIUM 9.0 03/27/2023   ANIONGAP 8 08/28/2018   EGFR 107 03/27/2023   Lab Results  Component Value Date   CHOL 170 03/27/2023   Lab Results  Component Value Date   HDL 65 03/27/2023   Lab Results  Component Value Date   LDLCALC 92 03/27/2023   Lab Results  Component Value Date   TRIG 68 03/27/2023   Lab Results  Component Value Date   CHOLHDL 2.6 03/27/2023   Lab Results  Component Value Date   HGBA1C 5.5 03/27/2023      Assessment & Plan:  Primary hypertension Assessment & Plan: Controlled A low-sodium diet of less than 2,300 mg daily is recommended, along with moderate-intensity physical activity for at least 150 minutes per week. The patient is encouraged to maintain these lifestyle modifications to help manage her blood pressure effectively.  Long-term considerations were discussed, emphasizing that uncontrolled hypertension increases the risk of cardiovascular diseases, including stroke, coronary artery disease, and heart failure.  The patient is encouraged to seek emergency care if blood pressure exceeds 180/120 and is accompanied by symptoms such as headaches, chest pain, palpitations, blurred vision, or dizziness. She verbalized understanding and will follow up as scheduled.    Type 2 diabetes mellitus without complication, without long-term current use of insulin (HCC) Assessment & Plan:  I  recommended reducing his intake of high-sugar foods and beverages and increasing physical activity to 150 minutes of moderate intensity per week, as tolerated  Lab Results  Component Value Date   HGBA1C 5.5 03/27/2023      Other hyperlipidemia Assessment & Plan: Lifestyle modifications were also discussed, including avoiding simple carbohydrates such as cakes, sweet desserts, ice cream, soda (diet or regular), sweet tea, candies, chips, cookies, store-bought juices, excessive alcohol (more than 1-2 drinks per day), lemonade, artificial sweeteners, donuts, coffee creamers, and sugar-free products. Additionally, the patient was advised to reduce the consumption of greasy, fatty foods and increase physical activity to support cardiovascular health. The patient verbalized understanding and is aware of the plan of care.   Orders: -     Lipid panel -     CMP14+EGFR -     CBC with Differential/Platelet  Viral illness Assessment & Plan: I recommend symptomatic treatments with the following: Start taking Promethazine DM 5 mL by mouth every 4 hours as needed for cough and cold symptoms. Increase fluid intake and allow for plenty of rest. Take Tylenol as needed for pain, fever, or general discomfort. Perform warm saltwater gargles 3-4 times daily to help with throat pain or discomfort. (Mix 1/2 teaspoon of salt in a glass of warm water and gargle several times daily to reduce throat inflammation and soothe irritation.) Ginger tea: Reduces throat irritation and can help with nausea. Look for sugar-free or honey-based throat lozenges to help with a sore throat. Use a humidifier at bedtime to help with cough and nasal congestion. For nasal congestion: The use of heated humidified air is a safe and effective therapy. Saline nasal sprays may also help alleviate nasal symptoms of the common cold. For cough: Treatment with honey may reduce  cough frequency and severity.   Orders: -     Promethazine-DM;  Take 5 mLs by mouth 4 (four) times daily as needed.  Dispense: 118 mL; Refill: 0  IFG (impaired fasting glucose) -     Hemoglobin A1c  Vitamin D deficiency -     VITAMIN D 25 Hydroxy (Vit-D Deficiency, Fractures)  TSH (thyroid-stimulating hormone deficiency) -     TSH + free T4  Note: This chart has been completed using Engineer, civil (consulting) software, and while attempts have been made to ensure accuracy, certain words and phrases may not be transcribed as intended.    Follow-up: Return in about 1 month (around 11/16/2023) for pap smear.   Gilmore Laroche, FNP

## 2023-10-19 NOTE — Patient Instructions (Signed)
 I appreciate the opportunity to provide care to you today!    Follow up:  1 month for pap smear  Fasting Labs: please stop by the lab today/during the week to get your blood drawn (CBC, CMP, TSH, Lipid profile, HgA1c, Vit D)  Viral Illness: I recommend symptomatic treatments with the following: Start taking Promethazine DM 5 mL by mouth every 4 hours as needed for cough and cold symptoms. Increase fluid intake and allow for plenty of rest. Take Tylenol as needed for pain, fever, or general discomfort. Use a humidifier at bedtime to help with cough and nasal congestion. For nasal congestion: The use of heated humidified air is a safe and effective therapy. Saline nasal sprays may also help alleviate nasal symptoms of the common cold. For cough: Treatment with honey may reduce cough frequency and severity. Follow up if your symptoms do not improve after 7 to 10 days of symptom onset.       Please continue to a heart-healthy diet and increase your physical activities. Try to exercise for at least five days a week.    It was a pleasure to see you and I look forward to continuing to work together on your health and well-being. Please do not hesitate to call the office if you need care or have questions about your care.  In case of emergency, please visit the Emergency Department for urgent care, or contact our clinic at [phone] to schedule an appointment. We're here to help you!   Have a wonderful day and week. With Gratitude, Gilmore Laroche MSN, FNP-BC

## 2023-10-21 DIAGNOSIS — B349 Viral infection, unspecified: Secondary | ICD-10-CM | POA: Insufficient documentation

## 2023-10-21 NOTE — Assessment & Plan Note (Signed)
 Lifestyle modifications were also discussed, including avoiding simple carbohydrates such as cakes, sweet desserts, ice cream, soda (diet or regular), sweet tea, candies, chips, cookies, store-bought juices, excessive alcohol (more than 1-2 drinks per day), lemonade, artificial sweeteners, donuts, coffee creamers, and sugar-free products. Additionally, the patient was advised to reduce the consumption of greasy, fatty foods and increase physical activity to support cardiovascular health. The patient verbalized understanding and is aware of the plan of care.

## 2023-10-21 NOTE — Assessment & Plan Note (Signed)
 I recommend symptomatic treatments with the following: Start taking Promethazine DM 5 mL by mouth every 4 hours as needed for cough and cold symptoms. Increase fluid intake and allow for plenty of rest. Take Tylenol as needed for pain, fever, or general discomfort. Perform warm saltwater gargles 3-4 times daily to help with throat pain or discomfort. (Mix 1/2 teaspoon of salt in a glass of warm water and gargle several times daily to reduce throat inflammation and soothe irritation.) Ginger tea: Reduces throat irritation and can help with nausea. Look for sugar-free or honey-based throat lozenges to help with a sore throat. Use a humidifier at bedtime to help with cough and nasal congestion. For nasal congestion: The use of heated humidified air is a safe and effective therapy. Saline nasal sprays may also help alleviate nasal symptoms of the common cold. For cough: Treatment with honey may reduce cough frequency and severity.

## 2023-10-21 NOTE — Assessment & Plan Note (Signed)
 I recommended reducing his intake of high-sugar foods and beverages and increasing physical activity to 150 minutes of moderate intensity per week, as tolerated  Lab Results  Component Value Date   HGBA1C 5.5 03/27/2023

## 2023-10-21 NOTE — Assessment & Plan Note (Signed)
 Controlled A low-sodium diet of less than 2,300 mg daily is recommended, along with moderate-intensity physical activity for at least 150 minutes per week. The patient is encouraged to maintain these lifestyle modifications to help manage her blood pressure effectively.  Long-term considerations were discussed, emphasizing that uncontrolled hypertension increases the risk of cardiovascular diseases, including stroke, coronary artery disease, and heart failure.  The patient is encouraged to seek emergency care if blood pressure exceeds 180/120 and is accompanied by symptoms such as headaches, chest pain, palpitations, blurred vision, or dizziness. She verbalized understanding and will follow up as scheduled.

## 2023-10-27 ENCOUNTER — Ambulatory Visit (HOSPITAL_COMMUNITY): Payer: Medicaid Other | Admitting: Psychiatry

## 2023-11-12 ENCOUNTER — Ambulatory Visit (INDEPENDENT_AMBULATORY_CARE_PROVIDER_SITE_OTHER): Payer: Medicaid Other | Admitting: Psychiatry

## 2023-11-12 DIAGNOSIS — F321 Major depressive disorder, single episode, moderate: Secondary | ICD-10-CM | POA: Diagnosis not present
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
## 2023-11-23 ENCOUNTER — Ambulatory Visit (INDEPENDENT_AMBULATORY_CARE_PROVIDER_SITE_OTHER): Payer: Medicaid Other | Admitting: Psychiatry

## 2023-11-23 DIAGNOSIS — F321 Major depressive disorder, single episode, moderate: Secondary | ICD-10-CM
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
## 2023-11-28 LAB — CMP14+EGFR
ALT: 22 IU/L (ref 0–32)
AST: 22 IU/L (ref 0–40)
Albumin: 4 g/dL (ref 3.9–4.9)
Alkaline Phosphatase: 36 IU/L — ABNORMAL LOW (ref 44–121)
BUN/Creatinine Ratio: 15 (ref 9–23)
BUN: 10 mg/dL (ref 6–24)
Bilirubin Total: 0.5 mg/dL (ref 0.0–1.2)
CO2: 27 mmol/L (ref 20–29)
Calcium: 9.2 mg/dL (ref 8.7–10.2)
Chloride: 104 mmol/L (ref 96–106)
Creatinine, Ser: 0.65 mg/dL (ref 0.57–1.00)
Globulin, Total: 1.8 g/dL (ref 1.5–4.5)
Glucose: 95 mg/dL (ref 70–99)
Potassium: 4.2 mmol/L (ref 3.5–5.2)
Sodium: 142 mmol/L (ref 134–144)
Total Protein: 5.8 g/dL — ABNORMAL LOW (ref 6.0–8.5)
eGFR: 108 mL/min/{1.73_m2} (ref >59)

## 2023-11-28 LAB — CBC WITH DIFFERENTIAL/PLATELET
Basophils Absolute: 0 10*3/uL (ref 0.0–0.2)
Basos: 1 %
EOS (ABSOLUTE): 0 10*3/uL (ref 0.0–0.4)
Eos: 0 %
Hematocrit: 34.4 % (ref 34.0–46.6)
Hemoglobin: 11.5 g/dL (ref 11.1–15.9)
Immature Grans (Abs): 0 10*3/uL (ref 0.0–0.1)
Immature Granulocytes: 0 %
Lymphocytes Absolute: 2.2 10*3/uL (ref 0.7–3.1)
Lymphs: 53 %
MCH: 32.1 pg (ref 26.6–33.0)
MCHC: 33.4 g/dL (ref 31.5–35.7)
MCV: 96 fL (ref 79–97)
Monocytes Absolute: 0.4 10*3/uL (ref 0.1–0.9)
Monocytes: 10 %
Neutrophils Absolute: 1.5 10*3/uL (ref 1.4–7.0)
Neutrophils: 36 %
Platelets: 296 10*3/uL (ref 150–450)
RBC: 3.58 x10E6/uL — ABNORMAL LOW (ref 3.77–5.28)
RDW: 13.7 % (ref 11.7–15.4)
WBC: 4.1 10*3/uL (ref 3.4–10.8)

## 2023-11-28 LAB — TSH+FREE T4
Free T4: 1.27 ng/dL (ref 0.82–1.77)
TSH: 1.43 u[IU]/mL (ref 0.450–4.500)

## 2023-11-28 LAB — LIPID PANEL
Chol/HDL Ratio: 2.4 ratio (ref 0.0–4.4)
Cholesterol, Total: 176 mg/dL (ref 100–199)
HDL: 73 mg/dL (ref >39)
LDL Chol Calc (NIH): 90 mg/dL (ref 0–99)
Triglycerides: 66 mg/dL (ref 0–149)
VLDL Cholesterol Cal: 13 mg/dL (ref 5–40)

## 2023-11-28 LAB — HEMOGLOBIN A1C
Est. average glucose Bld gHb Est-mCnc: 108 mg/dL
Hgb A1c MFr Bld: 5.4 % (ref 4.8–5.6)

## 2023-11-28 LAB — VITAMIN D 25 HYDROXY (VIT D DEFICIENCY, FRACTURES): Vit D, 25-Hydroxy: 39.7 ng/mL (ref 30.0–100.0)

## 2023-11-29 ENCOUNTER — Encounter: Payer: Self-pay | Admitting: Family Medicine

## 2023-12-07 ENCOUNTER — Ambulatory Visit (HOSPITAL_COMMUNITY): Payer: Medicaid Other | Admitting: Psychiatry

## 2023-12-10 ENCOUNTER — Ambulatory Visit: Payer: Medicaid Other | Admitting: Family Medicine

## 2023-12-15 ENCOUNTER — Encounter: Payer: Self-pay | Admitting: Family Medicine

## 2023-12-22 ENCOUNTER — Ambulatory Visit (HOSPITAL_COMMUNITY): Admitting: Psychiatry

## 2023-12-28 ENCOUNTER — Ambulatory Visit (INDEPENDENT_AMBULATORY_CARE_PROVIDER_SITE_OTHER): Admitting: Psychiatry

## 2023-12-28 DIAGNOSIS — F321 Major depressive disorder, single episode, moderate: Secondary | ICD-10-CM | POA: Diagnosis not present
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
## 2024-01-20 ENCOUNTER — Ambulatory Visit (HOSPITAL_COMMUNITY): Admitting: Psychiatry

## 2024-01-20 DIAGNOSIS — F321 Major depressive disorder, single episode, moderate: Secondary | ICD-10-CM | POA: Diagnosis not present
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
## 2024-02-15 ENCOUNTER — Ambulatory Visit: Payer: Self-pay | Admitting: Family Medicine

## 2024-03-28 ENCOUNTER — Ambulatory Visit (INDEPENDENT_AMBULATORY_CARE_PROVIDER_SITE_OTHER): Admitting: Psychiatry

## 2024-03-28 DIAGNOSIS — F321 Major depressive disorder, single episode, moderate: Secondary | ICD-10-CM | POA: Diagnosis not present
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
## 2024-04-11 ENCOUNTER — Ambulatory Visit (HOSPITAL_COMMUNITY): Admitting: Psychiatry

## 2024-05-09 ENCOUNTER — Ambulatory Visit (INDEPENDENT_AMBULATORY_CARE_PROVIDER_SITE_OTHER): Admitting: Psychiatry

## 2024-05-09 DIAGNOSIS — F321 Major depressive disorder, single episode, moderate: Secondary | ICD-10-CM

## 2024-05-09 NOTE — Progress Notes (Signed)
 SABRA
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
## 2024-05-23 ENCOUNTER — Ambulatory Visit (INDEPENDENT_AMBULATORY_CARE_PROVIDER_SITE_OTHER): Admitting: Psychiatry

## 2024-05-23 DIAGNOSIS — F321 Major depressive disorder, single episode, moderate: Secondary | ICD-10-CM
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
## 2024-06-06 ENCOUNTER — Ambulatory Visit (HOSPITAL_COMMUNITY): Admitting: Psychiatry

## 2024-06-06 DIAGNOSIS — F321 Major depressive disorder, single episode, moderate: Secondary | ICD-10-CM | POA: Diagnosis not present
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
## 2024-07-18 ENCOUNTER — Ambulatory Visit (HOSPITAL_COMMUNITY): Admitting: Psychiatry

## 2024-07-18 DIAGNOSIS — F321 Major depressive disorder, single episode, moderate: Secondary | ICD-10-CM
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
## 2024-07-28 ENCOUNTER — Ambulatory Visit: Payer: Self-pay | Admitting: Family Medicine

## 2024-08-01 ENCOUNTER — Ambulatory Visit (HOSPITAL_COMMUNITY): Admitting: Psychiatry

## 2024-08-01 DIAGNOSIS — F321 Major depressive disorder, single episode, moderate: Secondary | ICD-10-CM
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
## 2024-08-15 ENCOUNTER — Ambulatory Visit (INDEPENDENT_AMBULATORY_CARE_PROVIDER_SITE_OTHER): Admitting: Psychiatry

## 2024-08-15 DIAGNOSIS — F321 Major depressive disorder, single episode, moderate: Secondary | ICD-10-CM
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
## 2024-09-19 ENCOUNTER — Ambulatory Visit (INDEPENDENT_AMBULATORY_CARE_PROVIDER_SITE_OTHER): Admitting: Psychiatry

## 2024-09-19 DIAGNOSIS — F321 Major depressive disorder, single episode, moderate: Secondary | ICD-10-CM | POA: Diagnosis not present
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
## 2024-09-20 ENCOUNTER — Ambulatory Visit: Payer: Self-pay | Admitting: Family Medicine

## 2024-09-20 ENCOUNTER — Ambulatory Visit (INDEPENDENT_AMBULATORY_CARE_PROVIDER_SITE_OTHER): Admitting: Family Medicine

## 2024-09-20 ENCOUNTER — Ambulatory Visit: Payer: Self-pay

## 2024-09-20 ENCOUNTER — Ambulatory Visit (HOSPITAL_COMMUNITY)
Admission: RE | Admit: 2024-09-20 | Discharge: 2024-09-20 | Disposition: A | Discharge status: Home / Self Care | Source: Ambulatory Visit | Attending: Family Medicine | Admitting: Family Medicine

## 2024-09-20 ENCOUNTER — Encounter: Payer: Self-pay | Admitting: Family Medicine

## 2024-09-20 VITALS — BP 147/85 | HR 90 | Resp 16 | Ht 66.0 in | Wt 216.0 lb

## 2024-09-20 DIAGNOSIS — E785 Hyperlipidemia, unspecified: Secondary | ICD-10-CM | POA: Diagnosis not present

## 2024-09-20 DIAGNOSIS — E119 Type 2 diabetes mellitus without complications: Secondary | ICD-10-CM

## 2024-09-20 DIAGNOSIS — I10 Essential (primary) hypertension: Secondary | ICD-10-CM | POA: Diagnosis not present

## 2024-09-20 DIAGNOSIS — Z131 Encounter for screening for diabetes mellitus: Secondary | ICD-10-CM

## 2024-09-20 DIAGNOSIS — J019 Acute sinusitis, unspecified: Secondary | ICD-10-CM

## 2024-09-20 DIAGNOSIS — H6692 Otitis media, unspecified, left ear: Secondary | ICD-10-CM | POA: Diagnosis not present

## 2024-09-20 DIAGNOSIS — R053 Chronic cough: Secondary | ICD-10-CM | POA: Diagnosis present

## 2024-09-20 DIAGNOSIS — E559 Vitamin D deficiency, unspecified: Secondary | ICD-10-CM

## 2024-09-20 DIAGNOSIS — J209 Acute bronchitis, unspecified: Secondary | ICD-10-CM | POA: Diagnosis not present

## 2024-09-20 DIAGNOSIS — B9689 Other specified bacterial agents as the cause of diseases classified elsewhere: Secondary | ICD-10-CM

## 2024-09-20 MED ORDER — AMOXICILLIN-POT CLAVULANATE 875-125 MG PO TABS: 1.0000 | ORAL_TABLET | Freq: Two times a day (BID) | ORAL | 0 refills | Status: AC

## 2024-09-20 MED ORDER — FLUTICASONE PROPIONATE 50 MCG/ACT NA SUSP: 2.0000 | Freq: Every day | NASAL | 6 refills | Status: AC

## 2024-09-20 MED ORDER — BENZONATATE 100 MG PO CAPS: 100.0000 mg | ORAL_CAPSULE | Freq: Two times a day (BID) | ORAL | 0 refills | Status: AC | PRN

## 2024-09-20 MED ORDER — PROMETHAZINE-DM 6.25-15 MG/5ML PO SYRP: ORAL_SOLUTION | ORAL | 0 refills | Status: AC

## 2024-09-20 MED ORDER — PREDNISONE 10 MG PO TABS: 10.0000 mg | ORAL_TABLET | Freq: Two times a day (BID) | ORAL | 0 refills | Status: AC

## 2024-09-20 MED ORDER — CHLORPHENIRAMINE MALEATE 4 MG PO TABS: 4.0000 mg | ORAL_TABLET | Freq: Two times a day (BID) | ORAL | 0 refills | Status: AC | PRN

## 2024-09-20 MED ORDER — ALBUTEROL SULFATE HFA 108 (90 BASE) MCG/ACT IN AERS: 2.0000 | INHALATION_SPRAY | Freq: Four times a day (QID) | RESPIRATORY_TRACT | 0 refills | Status: AC | PRN

## 2024-09-20 MED ORDER — FLUCONAZOLE 150 MG PO TABS: 150.0000 mg | ORAL_TABLET | Freq: Once | ORAL | 0 refills | Status: AC

## 2024-09-20 NOTE — Assessment & Plan Note (Signed)
 4 week h/o worsening cough with fever intermittently chills, sOB and pain with cough,

## 2024-09-20 NOTE — Assessment & Plan Note (Signed)
 Diabetes associated with hypertension and obesity  Penny Luna is reminded of the importance of commitment to daily physical activity for 30 minutes or more, as able and the need to limit carbohydrate intake to 30 to 60 grams per meal to help with blood sugar control.   The need to take medication as prescribed, test blood sugar as directed, and to call between visits if there is a concern that blood sugar is uncontrolled is also discussed.   Penny Luna is reminded of the importance of daily foot exam, annual eye examination, and good blood sugar, blood pressure and cholesterol control.     Latest Ref Rng & Units 11/27/2023    9:11 AM 03/27/2023    9:58 AM 11/24/2022    9:30 AM 07/01/2022    9:11 AM 07/01/2022    9:07 AM  Diabetic Labs  HbA1c 4.8 - 5.6 % 5.4  5.5  5.4   5.3   Micro/Creat Ratio 0 - 29 mg/g creat    7    Chol 100 - 199 mg/dL 823  829  810   815   HDL >39 mg/dL 73  65  64   50   Calc LDL 0 - 99 mg/dL 90  92  887   881   Triglycerides 0 - 149 mg/dL 66  68  68   87   Creatinine 0.57 - 1.00 mg/dL 9.34  9.31  9.29   9.32       09/20/2024    4:19 PM 10/19/2023    1:34 PM 10/19/2023    1:28 PM 03/24/2023   11:20 AM 03/24/2023   11:16 AM 11/19/2022    2:08 PM 11/19/2022    2:05 PM  BP/Weight  Systolic BP 147 130 147 148 153 136 141  Diastolic BP 85 82 87 82 93 80 77  Wt. (Lbs) 216.04  204.04  220.12  238.04  BMI 34.87 kg/m2  32.93 kg/m2  35.53 kg/m2  38.42 kg/m2      Latest Ref Rng & Units 10/19/2023    1:20 PM 01/21/2023   12:00 AM  Foot/eye exam completion dates  Eye Exam No Retinopathy  No Retinopathy      Foot Form Completion  Done      This result is from an external source.      Updated lab needed at/ before next visit.

## 2024-09-20 NOTE — Telephone Encounter (Signed)
 Patient scheduled.

## 2024-09-20 NOTE — Assessment & Plan Note (Signed)
Augmentin x10 days 

## 2024-09-20 NOTE — Telephone Encounter (Signed)
 FYI Only or Action Required?: FYI only for provider: appointment scheduled on 09/20/24.  Patient was last seen in primary care on 10/19/2023 by Edman Meade PEDLAR, FNP.  Called Nurse Triage reporting Cough.  Symptoms began several weeks ago.  Interventions attempted: Nothing.  Symptoms are: unchanged.  Triage Disposition: See Physician Within 24 Hours  Patient/caregiver understands and will follow disposition?: Yes   Message from Avram MATSU sent at 09/20/2024  8:44 AM EST  Reason for Triage: cough that wont go away, sore throat. Pain and ribs and worse at night.   Reason for Disposition  [1] Continuous (nonstop) coughing interferes with work or school AND [2] no improvement using cough treatment per Care Advice  Answer Assessment - Initial Assessment Questions Scheduled 09/20/24 Advised call back or ED/911 if symptoms occur/worsen: severe diff breathing, chest pain > 5 min, faint. Patient verbalized understanding.   1. ONSET: When did the cough begin?      3 weeks ago flu, still having cough 2. SEVERITY: How bad is the cough today?      Severe at night 3. SPUTUM: Describe the color of your sputum (e.g., none, dry cough; clear, white, yellow, green)     yellowish 4. HEMOPTYSIS: Are you coughing up any blood? If Yes, ask: How much? (e.g., flecks, streaks, tablespoons, etc.)     no 5. DIFFICULTY BREATHING: Are you having difficulty breathing? If Yes, ask: How bad is it? (e.g., mild, moderate, severe)      No prob 6. FEVER: Do you have a fever? If Yes, ask: What is your temperature, how was it measured, and when did it start?     Denies fever chills n/v 7. CARDIAC HISTORY: Do you have any history of heart disease? (e.g., heart attack, congestive heart failure)      no 8. LUNG HISTORY: Do you have any history of lung disease?  (e.g., pulmonary embolus, asthma, emphysema)     no 9. PE RISK FACTORS: Do you have a history of blood clots? (or: recent major  surgery, recent prolonged travel, bedridden)     no 10. OTHER SYMPTOMS: Do you have any other symptoms? (e.g., runny nose, wheezing, chest pain)   Cough, sob at night, wheezing runny nose body aches  Protocols used: Cough - Acute Productive-A-AH

## 2024-09-20 NOTE — Assessment & Plan Note (Signed)
 Hyperlipidemia:Low fat diet discussed and encouraged.   Lipid Panel  Lab Results  Component Value Date   CHOL 176 11/27/2023   HDL 73 11/27/2023   LDLCALC 90 11/27/2023   TRIG 66 11/27/2023   CHOLHDL 2.4 11/27/2023     Updated lab needed at/ before next visit.

## 2024-09-20 NOTE — Assessment & Plan Note (Signed)
 Improved , BMI less than 35  Patient re-educated about  the importance of commitment to a  minimum of 150 minutes of exercise per week as able.  The importance of healthy food choices with portion control discussed, as well as eating regularly and within a 12 hour window most days. The need to choose clean , green food 50 to 75% of the time is discussed, as well as to make water the primary drink and set a goal of 64 ounces water daily.       09/20/2024    4:19 PM 10/19/2023    1:28 PM 03/24/2023   11:16 AM  Weight /BMI  Weight 216 lb 0.6 oz 204 lb 0.6 oz 220 lb 1.9 oz  Height 5' 6 (1.676 m) 5' 6 (1.676 m) 5' 6 (1.676 m)  BMI 34.87 kg/m2 32.93 kg/m2 35.53 kg/m2

## 2024-09-20 NOTE — Assessment & Plan Note (Signed)
 Augmentin  x 10 days, saline sinus flush twice daily'flonase  and chlorpheniramine 

## 2024-09-20 NOTE — Patient Instructions (Addendum)
 F/U in 6 weeks  Fastig lipid , cmp and EGFr, hBA1C, CBC, tSH and vit D this week  cXR today   Meds sent as listed  Work excuse from today return01/30/2026  Thanks for choosing [facility], we consider it a privelige to serve you.

## 2024-09-20 NOTE — Progress Notes (Signed)
 "  Penny Luna     MRN: [id number]      DOB: September 01, 1975  Chief Complaint  Patient presents with   Cough    Pt complains of productive cough x4 weeks. Worse at night. Had the flu a little over a month ago, other symptoms have resolved. States she has coughs so much that she becomes SOB. Complains fatigue as well.     HPI [name]  c/o 4 week h/o cough and chest congestion sputum is hick and yellow, fever as recently as last night. Feels weak at times. Positive flu exposure 1 month ago Increased post nasal drainage , pain with coughing, left ear discomfort ROS Denies chest pains, palpitations and leg swelling Denies abdominal pain, nausea, vomiting,diarrhea or constipation.   Denies dysuria, frequency, hesitancy or incontinence. Denies joint pain, swelling and limitation in mobility. Denies headaches, seizures, numbness, or tingling. Denies depression, anxiety or insomnia. Denies skin break down or rash.   PE  BP (!) 147/85   Pulse 90   Resp 16   Ht 5' 6 (1.676 m)   Wt 216 lb 0.6 oz (98 kg)   SpO2 91%   BMI 34.87 kg/m   Patient alert and oriented and in no cardiopulmonary distress.  HEENT: No facial asymmetry, EOMI,     Neck supple .left adenitis Left tm pink with reduced light reflex  Chest: decreased air entry ,  bilateral crackles, no wheezes.  CVS: S1, S2 no murmurs, no S3.Regular rate.  ABD: Soft non tender.   Ext: No edema  MS: Adequate ROM spine, shoulders, hips and knees.  Skin: Intact, no ulcerations or rash noted.  Psych: Good eye contact, normal affect. Memory intact not anxious or depressed appearing.  CNS: CN 2-12 intact, power,  normal throughout.no focal deficits noted.   Assessment & Plan  Chronic cough 4 week h/o worsening cough with fever intermittently chills, sOB and pain with cough,   Acute bronchitis Augmentin , tessalon  perles,  fluconazole  and phenergan  DM also XCR  ,no pneumonia  Acute bacterial  rhinosinusitis Augmentin  x 10 days, saline sinus flush twice daily'flonase  and chlorpheniramine   Left otitis media Augmentin  x 10 days  High blood pressure Uncontrolled, needs to comply with meds daily Re eval in 2 monhts DASH diet and commitment to daily physical activity for a minimum of 30 minutes discussed and encouraged, as a part of hypertension management. The importance of attaining a healthy weight is also discussed.     09/20/2024    4:19 PM 10/19/2023    1:34 PM 10/19/2023    1:28 PM 03/24/2023   11:20 AM 03/24/2023   11:16 AM 11/19/2022    2:08 PM 11/19/2022    2:05 PM  BP/Weight  Systolic BP 147 130 147 148 153 136 141  Diastolic BP 85 82 87 82 93 80 77  Wt. (Lbs) 216.04  204.04  220.12  238.04  BMI 34.87 kg/m2  32.93 kg/m2  35.53 kg/m2  38.42 kg/m2    '   Hyperlipidemia Hyperlipidemia:Low fat diet discussed and encouraged.   Lipid Panel  Lab Results  Component Value Date   CHOL 176 11/27/2023   HDL 73 11/27/2023   LDLCALC 90 11/27/2023   TRIG 66 11/27/2023   CHOLHDL 2.4 11/27/2023     Updated lab needed at/ before next visit.   Morbid obesity (HCC) Improved , BMI less than 35  Patient re-educated about  the importance of commitment to a  minimum of 150 minutes  of exercise per week as able.  The importance of healthy food choices with portion control discussed, as well as eating regularly and within a 12 hour window most days. The need to choose clean , green food 50 to 75% of the time is discussed, as well as to make water the primary drink and set a goal of 64 ounces water daily.       09/20/2024    4:19 PM 10/19/2023    1:28 PM 03/24/2023   11:16 AM  Weight /BMI  Weight 216 lb 0.6 oz 204 lb 0.6 oz 220 lb 1.9 oz  Height 5' 6 (1.676 m) 5' 6 (1.676 m) 5' 6 (1.676 m)  BMI 34.87 kg/m2 32.93 kg/m2 35.53 kg/m2      Type 2 diabetes mellitus without complications (HCC) Diabetes associated with hypertension and obesity  [name] is  reminded of the importance of commitment to daily physical activity for 30 minutes or more, as able and the need to limit carbohydrate intake to 30 to 60 grams per meal to help with blood sugar control.   The need to take medication as prescribed, test blood sugar as directed, and to call between visits if there is a concern that blood sugar is uncontrolled is also discussed.   [name] is reminded of the importance of daily foot exam, annual eye examination, and good blood sugar, blood pressure and cholesterol control.     Latest Ref Rng & Units 11/27/2023    9:11 AM 03/27/2023    9:58 AM 11/24/2022    9:30 AM 07/01/2022    9:11 AM 07/01/2022    9:07 AM  Diabetic Labs  HbA1c 4.8 - 5.6 % 5.4  5.5  5.4   5.3   Micro/Creat Ratio 0 - 29 mg/g creat    7    Chol 100 - 199 mg/dL 823  829  810   815   HDL >39 mg/dL 73  65  64   50   Calc LDL 0 - 99 mg/dL 90  92  887   881   Triglycerides 0 - 149 mg/dL 66  68  68   87   Creatinine 0.57 - 1.00 mg/dL 9.34  9.31  9.29   9.32       09/20/2024    4:19 PM 10/19/2023    1:34 PM 10/19/2023    1:28 PM 03/24/2023   11:20 AM 03/24/2023   11:16 AM 11/19/2022    2:08 PM 11/19/2022    2:05 PM  BP/Weight  Systolic BP 147 130 147 148 153 136 141  Diastolic BP 85 82 87 82 93 80 77  Wt. (Lbs) 216.04  204.04  220.12  238.04  BMI 34.87 kg/m2  32.93 kg/m2  35.53 kg/m2  38.42 kg/m2      Latest Ref Rng & Units 10/19/2023    1:20 PM 01/21/2023   12:00 AM  Foot/eye exam completion dates  Eye Exam No Retinopathy  No Retinopathy      Foot Form Completion  Done      This result is from an external source.      Updated lab needed at/ before next visit.  "

## 2024-09-20 NOTE — Assessment & Plan Note (Signed)
 Uncontrolled, needs to comply with meds daily Re eval in 2 monhts DASH diet and commitment to daily physical activity for a minimum of 30 minutes discussed and encouraged, as a part of hypertension management. The importance of attaining a healthy weight is also discussed.     09/20/2024    4:19 PM 10/19/2023    1:34 PM 10/19/2023    1:28 PM 03/24/2023   11:20 AM 03/24/2023   11:16 AM 11/19/2022    2:08 PM 11/19/2022    2:05 PM  BP/Weight  Systolic BP 147 130 147 148 153 136 141  Diastolic BP 85 82 87 82 93 80 77  Wt. (Lbs) 216.04  204.04  220.12  238.04  BMI 34.87 kg/m2  32.93 kg/m2  35.53 kg/m2  38.42 kg/m2    '

## 2024-09-20 NOTE — Assessment & Plan Note (Signed)
 Augmentin , tessalon  perles,  fluconazole  and phenergan  DM also XCR  ,no pneumonia

## 2024-09-22 ENCOUNTER — Telehealth: Payer: Self-pay | Admitting: Family Medicine

## 2024-09-22 LAB — LIPID PANEL
Chol/HDL Ratio: 2.2 ratio (ref 0.0–4.4)
Cholesterol, Total: 179 mg/dL (ref 100–199)
HDL: 80 mg/dL
LDL Chol Calc (NIH): 86 mg/dL (ref 0–99)
Triglycerides: 69 mg/dL (ref 0–149)
VLDL Cholesterol Cal: 13 mg/dL (ref 5–40)

## 2024-09-22 LAB — CBC WITH DIFFERENTIAL/PLATELET
Basophils Absolute: 0 10*3/uL (ref 0.0–0.2)
Basos: 1 %
EOS (ABSOLUTE): 0.1 10*3/uL (ref 0.0–0.4)
Eos: 2 %
Hematocrit: 40.4 % (ref 34.0–46.6)
Hemoglobin: 13.5 g/dL (ref 11.1–15.9)
Immature Grans (Abs): 0 10*3/uL (ref 0.0–0.1)
Immature Granulocytes: 0 %
Lymphocytes Absolute: 2 10*3/uL (ref 0.7–3.1)
Lymphs: 38 %
MCH: 31.7 pg (ref 26.6–33.0)
MCHC: 33.4 g/dL (ref 31.5–35.7)
MCV: 95 fL (ref 79–97)
Monocytes Absolute: 1 10*3/uL — ABNORMAL HIGH (ref 0.1–0.9)
Monocytes: 20 %
Neutrophils Absolute: 2 10*3/uL (ref 1.4–7.0)
Neutrophils: 39 %
Platelets: 344 10*3/uL (ref 150–450)
RBC: 4.26 x10E6/uL (ref 3.77–5.28)
RDW: 13 % (ref 11.7–15.4)
WBC: 5.2 10*3/uL (ref 3.4–10.8)

## 2024-09-22 LAB — CMP14+EGFR
ALT: 41 [IU]/L — ABNORMAL HIGH (ref 0–32)
AST: 32 [IU]/L (ref 0–40)
Albumin: 4.3 g/dL (ref 3.9–4.9)
Alkaline Phosphatase: 54 [IU]/L (ref 41–116)
BUN/Creatinine Ratio: 12 (ref 9–23)
BUN: 10 mg/dL (ref 6–24)
Bilirubin Total: 0.3 mg/dL (ref 0.0–1.2)
CO2: 26 mmol/L (ref 20–29)
Calcium: 9.2 mg/dL (ref 8.7–10.2)
Chloride: 99 mmol/L (ref 96–106)
Creatinine, Ser: 0.82 mg/dL (ref 0.57–1.00)
Globulin, Total: 2.3 g/dL (ref 1.5–4.5)
Glucose: 91 mg/dL (ref 70–99)
Potassium: 5 mmol/L (ref 3.5–5.2)
Sodium: 140 mmol/L (ref 134–144)
Total Protein: 6.6 g/dL (ref 6.0–8.5)
eGFR: 87 mL/min/{1.73_m2}

## 2024-09-22 LAB — HEMOGLOBIN A1C
Est. average glucose Bld gHb Est-mCnc: 111 mg/dL
Hgb A1c MFr Bld: 5.5 % (ref 4.8–5.6)

## 2024-09-22 LAB — VITAMIN D 25 HYDROXY (VIT D DEFICIENCY, FRACTURES): Vit D, 25-Hydroxy: 31 ng/mL (ref 30.0–100.0)

## 2024-09-22 LAB — TSH: TSH: 1.19 u[IU]/mL (ref 0.450–4.500)

## 2024-09-22 NOTE — Telephone Encounter (Unsigned)
 Copied from CRM #[id number]. Topic: General - Other >> Sep 22, 2024 11:31 AM Willma SAUNDERS wrote: Reason for CRM: Patient was in the office on 01/27 for bronchitis and pneumonia. Was given a drs note through today to return to work tomorrow. States she is still having symptoms, not worse but the same wasn't able to get her medication until yesterday and would like to extend to return to work on Monday 09/26/24. Is requesting to see if a new dr note can be sent to her through her MyChart.   Patient can be reached at [phone]

## 2024-09-22 NOTE — Telephone Encounter (Signed)
 Sent via [employer]

## 2024-09-22 NOTE — Telephone Encounter (Signed)
 Kindly provide note

## 2024-10-03 ENCOUNTER — Ambulatory Visit (HOSPITAL_COMMUNITY): Admitting: Psychiatry

## 2024-10-17 ENCOUNTER — Ambulatory Visit (HOSPITAL_COMMUNITY): Admitting: Psychiatry

## 2024-10-31 ENCOUNTER — Ambulatory Visit (HOSPITAL_COMMUNITY): Admitting: Psychiatry

## 2024-11-23 ENCOUNTER — Ambulatory Visit: Payer: Self-pay | Admitting: Family Medicine
